# Patient Record
Sex: Female | Born: 1940 | Race: White | Hispanic: No | Smoking: Never smoker
Health system: Southern US, Community
[De-identification: ages and names within clinical notes are randomized; demographics above are authoritative.]

## PROBLEM LIST (undated history)

## (undated) DIAGNOSIS — Z8619 Personal history of other infectious and parasitic diseases: Secondary | ICD-10-CM

## (undated) DIAGNOSIS — E119 Type 2 diabetes mellitus without complications: Secondary | ICD-10-CM

## (undated) DIAGNOSIS — J45909 Unspecified asthma, uncomplicated: Secondary | ICD-10-CM

## (undated) DIAGNOSIS — I10 Essential (primary) hypertension: Secondary | ICD-10-CM

## (undated) DIAGNOSIS — F419 Anxiety disorder, unspecified: Secondary | ICD-10-CM

## (undated) HISTORY — PX: HERNIA REPAIR: SHX51

## (undated) HISTORY — PX: CHOLECYSTECTOMY: SHX55

---

## 2003-08-11 ENCOUNTER — Other Ambulatory Visit: Payer: Self-pay

## 2004-06-06 ENCOUNTER — Ambulatory Visit: Payer: Self-pay | Admitting: Internal Medicine

## 2004-06-19 ENCOUNTER — Emergency Department: Payer: Self-pay | Admitting: Emergency Medicine

## 2004-06-26 ENCOUNTER — Emergency Department: Payer: Self-pay | Admitting: Emergency Medicine

## 2004-10-22 ENCOUNTER — Emergency Department: Payer: Self-pay | Admitting: Emergency Medicine

## 2007-04-06 ENCOUNTER — Ambulatory Visit: Payer: Self-pay | Admitting: Gastroenterology

## 2007-09-09 ENCOUNTER — Ambulatory Visit: Payer: Self-pay | Admitting: Internal Medicine

## 2007-12-06 ENCOUNTER — Emergency Department: Payer: Self-pay | Admitting: Emergency Medicine

## 2007-12-06 ENCOUNTER — Other Ambulatory Visit: Payer: Self-pay

## 2007-12-14 ENCOUNTER — Other Ambulatory Visit: Payer: Self-pay

## 2007-12-16 ENCOUNTER — Inpatient Hospital Stay: Payer: Self-pay | Admitting: Internal Medicine

## 2008-02-17 ENCOUNTER — Inpatient Hospital Stay: Payer: Self-pay | Admitting: Internal Medicine

## 2008-04-05 ENCOUNTER — Ambulatory Visit: Payer: Self-pay | Admitting: Internal Medicine

## 2008-11-06 ENCOUNTER — Emergency Department: Payer: Self-pay | Admitting: Emergency Medicine

## 2010-10-15 ENCOUNTER — Emergency Department: Payer: Self-pay | Admitting: Emergency Medicine

## 2010-12-21 ENCOUNTER — Emergency Department: Payer: Self-pay | Admitting: Unknown Physician Specialty

## 2011-07-27 ENCOUNTER — Ambulatory Visit: Payer: Self-pay | Admitting: Internal Medicine

## 2011-12-02 DIAGNOSIS — E1142 Type 2 diabetes mellitus with diabetic polyneuropathy: Secondary | ICD-10-CM | POA: Diagnosis not present

## 2011-12-02 DIAGNOSIS — I10 Essential (primary) hypertension: Secondary | ICD-10-CM | POA: Diagnosis not present

## 2011-12-16 DIAGNOSIS — L270 Generalized skin eruption due to drugs and medicaments taken internally: Secondary | ICD-10-CM | POA: Diagnosis not present

## 2011-12-27 DIAGNOSIS — F411 Generalized anxiety disorder: Secondary | ICD-10-CM | POA: Diagnosis not present

## 2011-12-27 DIAGNOSIS — I10 Essential (primary) hypertension: Secondary | ICD-10-CM | POA: Diagnosis not present

## 2011-12-27 DIAGNOSIS — E1142 Type 2 diabetes mellitus with diabetic polyneuropathy: Secondary | ICD-10-CM | POA: Diagnosis not present

## 2012-01-15 DIAGNOSIS — I10 Essential (primary) hypertension: Secondary | ICD-10-CM | POA: Diagnosis not present

## 2012-01-15 DIAGNOSIS — N3000 Acute cystitis without hematuria: Secondary | ICD-10-CM | POA: Diagnosis not present

## 2012-01-15 DIAGNOSIS — F411 Generalized anxiety disorder: Secondary | ICD-10-CM | POA: Diagnosis not present

## 2012-01-31 DIAGNOSIS — I10 Essential (primary) hypertension: Secondary | ICD-10-CM | POA: Diagnosis not present

## 2012-01-31 DIAGNOSIS — F411 Generalized anxiety disorder: Secondary | ICD-10-CM | POA: Diagnosis not present

## 2012-01-31 DIAGNOSIS — E1142 Type 2 diabetes mellitus with diabetic polyneuropathy: Secondary | ICD-10-CM | POA: Diagnosis not present

## 2012-03-20 DIAGNOSIS — E669 Obesity, unspecified: Secondary | ICD-10-CM | POA: Diagnosis not present

## 2012-03-20 DIAGNOSIS — T428X5A Adverse effect of antiparkinsonism drugs and other central muscle-tone depressants, initial encounter: Secondary | ICD-10-CM | POA: Diagnosis not present

## 2012-03-20 DIAGNOSIS — I10 Essential (primary) hypertension: Secondary | ICD-10-CM | POA: Diagnosis not present

## 2012-05-29 DIAGNOSIS — E1149 Type 2 diabetes mellitus with other diabetic neurological complication: Secondary | ICD-10-CM | POA: Diagnosis not present

## 2012-05-29 DIAGNOSIS — E119 Type 2 diabetes mellitus without complications: Secondary | ICD-10-CM | POA: Diagnosis not present

## 2012-05-29 DIAGNOSIS — T428X5A Adverse effect of antiparkinsonism drugs and other central muscle-tone depressants, initial encounter: Secondary | ICD-10-CM | POA: Diagnosis not present

## 2012-06-26 DIAGNOSIS — E119 Type 2 diabetes mellitus without complications: Secondary | ICD-10-CM | POA: Diagnosis not present

## 2012-06-26 DIAGNOSIS — I10 Essential (primary) hypertension: Secondary | ICD-10-CM | POA: Diagnosis not present

## 2012-06-26 DIAGNOSIS — E1149 Type 2 diabetes mellitus with other diabetic neurological complication: Secondary | ICD-10-CM | POA: Diagnosis not present

## 2012-06-26 DIAGNOSIS — T783XXA Angioneurotic edema, initial encounter: Secondary | ICD-10-CM | POA: Diagnosis not present

## 2012-07-03 DIAGNOSIS — D649 Anemia, unspecified: Secondary | ICD-10-CM | POA: Diagnosis not present

## 2012-07-03 DIAGNOSIS — E7800 Pure hypercholesterolemia, unspecified: Secondary | ICD-10-CM | POA: Diagnosis not present

## 2012-07-24 DIAGNOSIS — I739 Peripheral vascular disease, unspecified: Secondary | ICD-10-CM | POA: Diagnosis not present

## 2012-07-24 DIAGNOSIS — F411 Generalized anxiety disorder: Secondary | ICD-10-CM | POA: Diagnosis not present

## 2012-07-24 DIAGNOSIS — G219 Secondary parkinsonism, unspecified: Secondary | ICD-10-CM | POA: Diagnosis not present

## 2012-09-28 DIAGNOSIS — M545 Low back pain: Secondary | ICD-10-CM | POA: Diagnosis not present

## 2012-09-28 DIAGNOSIS — N3000 Acute cystitis without hematuria: Secondary | ICD-10-CM | POA: Diagnosis not present

## 2012-10-12 DIAGNOSIS — J00 Acute nasopharyngitis [common cold]: Secondary | ICD-10-CM | POA: Diagnosis not present

## 2012-12-03 DIAGNOSIS — K140 Glossitis: Secondary | ICD-10-CM | POA: Diagnosis not present

## 2012-12-03 DIAGNOSIS — E785 Hyperlipidemia, unspecified: Secondary | ICD-10-CM | POA: Diagnosis not present

## 2012-12-03 DIAGNOSIS — I10 Essential (primary) hypertension: Secondary | ICD-10-CM | POA: Diagnosis not present

## 2012-12-03 DIAGNOSIS — G219 Secondary parkinsonism, unspecified: Secondary | ICD-10-CM | POA: Diagnosis not present

## 2012-12-11 DIAGNOSIS — K1370 Unspecified lesions of oral mucosa: Secondary | ICD-10-CM | POA: Diagnosis not present

## 2012-12-11 DIAGNOSIS — D1039 Benign neoplasm of other parts of mouth: Secondary | ICD-10-CM | POA: Diagnosis not present

## 2013-01-20 DIAGNOSIS — E11329 Type 2 diabetes mellitus with mild nonproliferative diabetic retinopathy without macular edema: Secondary | ICD-10-CM | POA: Diagnosis not present

## 2013-02-05 DIAGNOSIS — E669 Obesity, unspecified: Secondary | ICD-10-CM | POA: Diagnosis not present

## 2013-02-05 DIAGNOSIS — G219 Secondary parkinsonism, unspecified: Secondary | ICD-10-CM | POA: Diagnosis not present

## 2013-02-05 DIAGNOSIS — T783XXA Angioneurotic edema, initial encounter: Secondary | ICD-10-CM | POA: Diagnosis not present

## 2013-02-05 DIAGNOSIS — E785 Hyperlipidemia, unspecified: Secondary | ICD-10-CM | POA: Diagnosis not present

## 2013-02-12 DIAGNOSIS — H18419 Arcus senilis, unspecified eye: Secondary | ICD-10-CM | POA: Diagnosis not present

## 2013-02-12 DIAGNOSIS — H25019 Cortical age-related cataract, unspecified eye: Secondary | ICD-10-CM | POA: Diagnosis not present

## 2013-02-12 DIAGNOSIS — E119 Type 2 diabetes mellitus without complications: Secondary | ICD-10-CM | POA: Diagnosis not present

## 2013-02-12 DIAGNOSIS — H2510 Age-related nuclear cataract, unspecified eye: Secondary | ICD-10-CM | POA: Diagnosis not present

## 2013-03-11 DIAGNOSIS — B078 Other viral warts: Secondary | ICD-10-CM | POA: Diagnosis not present

## 2013-03-11 DIAGNOSIS — E119 Type 2 diabetes mellitus without complications: Secondary | ICD-10-CM | POA: Diagnosis not present

## 2013-03-11 DIAGNOSIS — L570 Actinic keratosis: Secondary | ICD-10-CM | POA: Diagnosis not present

## 2013-04-12 DIAGNOSIS — H2510 Age-related nuclear cataract, unspecified eye: Secondary | ICD-10-CM | POA: Diagnosis not present

## 2013-04-12 DIAGNOSIS — H269 Unspecified cataract: Secondary | ICD-10-CM | POA: Diagnosis not present

## 2013-04-13 DIAGNOSIS — H2510 Age-related nuclear cataract, unspecified eye: Secondary | ICD-10-CM | POA: Diagnosis not present

## 2013-05-03 DIAGNOSIS — H269 Unspecified cataract: Secondary | ICD-10-CM | POA: Diagnosis not present

## 2013-05-03 DIAGNOSIS — H2510 Age-related nuclear cataract, unspecified eye: Secondary | ICD-10-CM | POA: Diagnosis not present

## 2013-05-11 DIAGNOSIS — E1349 Other specified diabetes mellitus with other diabetic neurological complication: Secondary | ICD-10-CM | POA: Diagnosis not present

## 2013-05-11 DIAGNOSIS — E1142 Type 2 diabetes mellitus with diabetic polyneuropathy: Secondary | ICD-10-CM | POA: Diagnosis not present

## 2013-05-11 DIAGNOSIS — E1149 Type 2 diabetes mellitus with other diabetic neurological complication: Secondary | ICD-10-CM | POA: Diagnosis not present

## 2013-07-13 DIAGNOSIS — N390 Urinary tract infection, site not specified: Secondary | ICD-10-CM | POA: Diagnosis not present

## 2013-07-13 DIAGNOSIS — E785 Hyperlipidemia, unspecified: Secondary | ICD-10-CM | POA: Diagnosis not present

## 2013-07-13 DIAGNOSIS — G219 Secondary parkinsonism, unspecified: Secondary | ICD-10-CM | POA: Diagnosis not present

## 2013-07-13 DIAGNOSIS — I10 Essential (primary) hypertension: Secondary | ICD-10-CM | POA: Diagnosis not present

## 2013-07-19 DIAGNOSIS — K055 Other periodontal diseases: Secondary | ICD-10-CM | POA: Diagnosis not present

## 2013-08-05 DIAGNOSIS — E785 Hyperlipidemia, unspecified: Secondary | ICD-10-CM | POA: Diagnosis not present

## 2013-08-05 DIAGNOSIS — T428X5A Adverse effect of antiparkinsonism drugs and other central muscle-tone depressants, initial encounter: Secondary | ICD-10-CM | POA: Diagnosis not present

## 2013-08-05 DIAGNOSIS — T783XXA Angioneurotic edema, initial encounter: Secondary | ICD-10-CM | POA: Diagnosis not present

## 2013-08-05 DIAGNOSIS — I10 Essential (primary) hypertension: Secondary | ICD-10-CM | POA: Diagnosis not present

## 2013-10-19 DIAGNOSIS — T783XXA Angioneurotic edema, initial encounter: Secondary | ICD-10-CM | POA: Diagnosis not present

## 2013-10-19 DIAGNOSIS — J069 Acute upper respiratory infection, unspecified: Secondary | ICD-10-CM | POA: Diagnosis not present

## 2013-10-19 DIAGNOSIS — J42 Unspecified chronic bronchitis: Secondary | ICD-10-CM | POA: Diagnosis not present

## 2013-10-19 DIAGNOSIS — I10 Essential (primary) hypertension: Secondary | ICD-10-CM | POA: Diagnosis not present

## 2013-11-25 DIAGNOSIS — E119 Type 2 diabetes mellitus without complications: Secondary | ICD-10-CM | POA: Diagnosis not present

## 2013-11-25 DIAGNOSIS — I10 Essential (primary) hypertension: Secondary | ICD-10-CM | POA: Diagnosis not present

## 2013-11-25 DIAGNOSIS — J309 Allergic rhinitis, unspecified: Secondary | ICD-10-CM | POA: Diagnosis not present

## 2013-11-25 DIAGNOSIS — J209 Acute bronchitis, unspecified: Secondary | ICD-10-CM | POA: Diagnosis not present

## 2013-12-24 DIAGNOSIS — I10 Essential (primary) hypertension: Secondary | ICD-10-CM | POA: Diagnosis not present

## 2013-12-24 DIAGNOSIS — E669 Obesity, unspecified: Secondary | ICD-10-CM | POA: Diagnosis not present

## 2013-12-24 DIAGNOSIS — T783XXA Angioneurotic edema, initial encounter: Secondary | ICD-10-CM | POA: Diagnosis not present

## 2013-12-24 DIAGNOSIS — T428X5A Adverse effect of antiparkinsonism drugs and other central muscle-tone depressants, initial encounter: Secondary | ICD-10-CM | POA: Diagnosis not present

## 2014-02-25 DIAGNOSIS — IMO0001 Reserved for inherently not codable concepts without codable children: Secondary | ICD-10-CM | POA: Diagnosis not present

## 2014-02-25 DIAGNOSIS — E119 Type 2 diabetes mellitus without complications: Secondary | ICD-10-CM | POA: Diagnosis not present

## 2014-05-05 DIAGNOSIS — G219 Secondary parkinsonism, unspecified: Secondary | ICD-10-CM | POA: Diagnosis not present

## 2014-05-05 DIAGNOSIS — E669 Obesity, unspecified: Secondary | ICD-10-CM | POA: Diagnosis not present

## 2014-05-05 DIAGNOSIS — T783XXA Angioneurotic edema, initial encounter: Secondary | ICD-10-CM | POA: Diagnosis not present

## 2014-06-22 DIAGNOSIS — Z23 Encounter for immunization: Secondary | ICD-10-CM | POA: Diagnosis not present

## 2014-08-09 DIAGNOSIS — E1140 Type 2 diabetes mellitus with diabetic neuropathy, unspecified: Secondary | ICD-10-CM | POA: Diagnosis not present

## 2014-08-09 DIAGNOSIS — E11319 Type 2 diabetes mellitus with unspecified diabetic retinopathy without macular edema: Secondary | ICD-10-CM | POA: Diagnosis not present

## 2014-08-09 DIAGNOSIS — E119 Type 2 diabetes mellitus without complications: Secondary | ICD-10-CM | POA: Diagnosis not present

## 2014-08-09 DIAGNOSIS — E784 Other hyperlipidemia: Secondary | ICD-10-CM | POA: Diagnosis not present

## 2014-08-22 ENCOUNTER — Ambulatory Visit: Payer: Self-pay | Admitting: Internal Medicine

## 2014-08-22 DIAGNOSIS — J219 Acute bronchiolitis, unspecified: Secondary | ICD-10-CM | POA: Diagnosis not present

## 2014-08-22 DIAGNOSIS — M4696 Unspecified inflammatory spondylopathy, lumbar region: Secondary | ICD-10-CM | POA: Diagnosis not present

## 2014-08-22 DIAGNOSIS — E119 Type 2 diabetes mellitus without complications: Secondary | ICD-10-CM | POA: Diagnosis not present

## 2014-08-22 DIAGNOSIS — J441 Chronic obstructive pulmonary disease with (acute) exacerbation: Secondary | ICD-10-CM | POA: Diagnosis not present

## 2014-08-22 DIAGNOSIS — M438X6 Other specified deforming dorsopathies, lumbar region: Secondary | ICD-10-CM | POA: Diagnosis not present

## 2014-08-22 DIAGNOSIS — M5417 Radiculopathy, lumbosacral region: Secondary | ICD-10-CM | POA: Diagnosis not present

## 2014-08-29 DIAGNOSIS — T783XXA Angioneurotic edema, initial encounter: Secondary | ICD-10-CM | POA: Diagnosis not present

## 2014-08-29 DIAGNOSIS — G219 Secondary parkinsonism, unspecified: Secondary | ICD-10-CM | POA: Diagnosis not present

## 2014-08-29 DIAGNOSIS — J399 Disease of upper respiratory tract, unspecified: Secondary | ICD-10-CM | POA: Diagnosis not present

## 2014-08-29 DIAGNOSIS — M5440 Lumbago with sciatica, unspecified side: Secondary | ICD-10-CM | POA: Diagnosis not present

## 2014-10-04 DIAGNOSIS — Z888 Allergy status to other drugs, medicaments and biological substances status: Secondary | ICD-10-CM | POA: Diagnosis not present

## 2014-10-04 DIAGNOSIS — E1140 Type 2 diabetes mellitus with diabetic neuropathy, unspecified: Secondary | ICD-10-CM | POA: Diagnosis not present

## 2014-10-04 DIAGNOSIS — G20 Parkinson's disease: Secondary | ICD-10-CM | POA: Diagnosis not present

## 2014-10-04 DIAGNOSIS — I10 Essential (primary) hypertension: Secondary | ICD-10-CM | POA: Diagnosis not present

## 2014-11-03 DIAGNOSIS — E119 Type 2 diabetes mellitus without complications: Secondary | ICD-10-CM | POA: Diagnosis not present

## 2014-11-03 DIAGNOSIS — I10 Essential (primary) hypertension: Secondary | ICD-10-CM | POA: Diagnosis not present

## 2014-11-03 DIAGNOSIS — G219 Secondary parkinsonism, unspecified: Secondary | ICD-10-CM | POA: Diagnosis not present

## 2014-11-03 DIAGNOSIS — Z888 Allergy status to other drugs, medicaments and biological substances status: Secondary | ICD-10-CM | POA: Diagnosis not present

## 2014-12-13 DIAGNOSIS — G219 Secondary parkinsonism, unspecified: Secondary | ICD-10-CM | POA: Diagnosis not present

## 2014-12-13 DIAGNOSIS — R0602 Shortness of breath: Secondary | ICD-10-CM | POA: Diagnosis not present

## 2014-12-13 DIAGNOSIS — J219 Acute bronchiolitis, unspecified: Secondary | ICD-10-CM | POA: Diagnosis not present

## 2014-12-13 DIAGNOSIS — E784 Other hyperlipidemia: Secondary | ICD-10-CM | POA: Diagnosis not present

## 2014-12-20 DIAGNOSIS — E784 Other hyperlipidemia: Secondary | ICD-10-CM | POA: Diagnosis not present

## 2014-12-20 DIAGNOSIS — R0602 Shortness of breath: Secondary | ICD-10-CM | POA: Diagnosis not present

## 2014-12-20 DIAGNOSIS — T783XXA Angioneurotic edema, initial encounter: Secondary | ICD-10-CM | POA: Diagnosis not present

## 2014-12-20 DIAGNOSIS — G219 Secondary parkinsonism, unspecified: Secondary | ICD-10-CM | POA: Diagnosis not present

## 2015-01-31 DIAGNOSIS — E669 Obesity, unspecified: Secondary | ICD-10-CM | POA: Diagnosis not present

## 2015-01-31 DIAGNOSIS — Z888 Allergy status to other drugs, medicaments and biological substances status: Secondary | ICD-10-CM | POA: Diagnosis not present

## 2015-01-31 DIAGNOSIS — E119 Type 2 diabetes mellitus without complications: Secondary | ICD-10-CM | POA: Diagnosis not present

## 2015-01-31 DIAGNOSIS — G219 Secondary parkinsonism, unspecified: Secondary | ICD-10-CM | POA: Diagnosis not present

## 2015-03-10 DIAGNOSIS — E119 Type 2 diabetes mellitus without complications: Secondary | ICD-10-CM | POA: Diagnosis not present

## 2015-03-13 DIAGNOSIS — R251 Tremor, unspecified: Secondary | ICD-10-CM | POA: Diagnosis not present

## 2015-03-13 DIAGNOSIS — L0390 Cellulitis, unspecified: Secondary | ICD-10-CM | POA: Diagnosis not present

## 2015-03-13 DIAGNOSIS — E1140 Type 2 diabetes mellitus with diabetic neuropathy, unspecified: Secondary | ICD-10-CM | POA: Diagnosis not present

## 2015-03-13 DIAGNOSIS — E119 Type 2 diabetes mellitus without complications: Secondary | ICD-10-CM | POA: Diagnosis not present

## 2015-03-20 DIAGNOSIS — R143 Flatulence: Secondary | ICD-10-CM | POA: Diagnosis not present

## 2015-03-20 DIAGNOSIS — M1A0711 Idiopathic chronic gout, right ankle and foot, with tophus (tophi): Secondary | ICD-10-CM | POA: Diagnosis not present

## 2015-03-20 DIAGNOSIS — R141 Gas pain: Secondary | ICD-10-CM | POA: Diagnosis not present

## 2015-03-20 DIAGNOSIS — L0390 Cellulitis, unspecified: Secondary | ICD-10-CM | POA: Diagnosis not present

## 2015-03-27 DIAGNOSIS — R143 Flatulence: Secondary | ICD-10-CM | POA: Diagnosis not present

## 2015-03-27 DIAGNOSIS — R141 Gas pain: Secondary | ICD-10-CM | POA: Diagnosis not present

## 2015-03-27 DIAGNOSIS — M109 Gout, unspecified: Secondary | ICD-10-CM | POA: Diagnosis not present

## 2015-03-27 DIAGNOSIS — M1A0711 Idiopathic chronic gout, right ankle and foot, with tophus (tophi): Secondary | ICD-10-CM | POA: Diagnosis not present

## 2015-04-14 DIAGNOSIS — I10 Essential (primary) hypertension: Secondary | ICD-10-CM | POA: Diagnosis not present

## 2015-04-14 DIAGNOSIS — M109 Gout, unspecified: Secondary | ICD-10-CM | POA: Diagnosis not present

## 2015-04-14 DIAGNOSIS — G219 Secondary parkinsonism, unspecified: Secondary | ICD-10-CM | POA: Diagnosis not present

## 2015-04-14 DIAGNOSIS — G20 Parkinson's disease: Secondary | ICD-10-CM | POA: Diagnosis not present

## 2015-05-18 DIAGNOSIS — G219 Secondary parkinsonism, unspecified: Secondary | ICD-10-CM | POA: Diagnosis not present

## 2015-05-18 DIAGNOSIS — R05 Cough: Secondary | ICD-10-CM | POA: Diagnosis not present

## 2015-05-18 DIAGNOSIS — M1A0711 Idiopathic chronic gout, right ankle and foot, with tophus (tophi): Secondary | ICD-10-CM | POA: Diagnosis not present

## 2015-05-18 DIAGNOSIS — I10 Essential (primary) hypertension: Secondary | ICD-10-CM | POA: Diagnosis not present

## 2015-05-30 DIAGNOSIS — Z23 Encounter for immunization: Secondary | ICD-10-CM | POA: Diagnosis not present

## 2015-07-11 DIAGNOSIS — E1140 Type 2 diabetes mellitus with diabetic neuropathy, unspecified: Secondary | ICD-10-CM | POA: Diagnosis not present

## 2015-07-11 DIAGNOSIS — R141 Gas pain: Secondary | ICD-10-CM | POA: Diagnosis not present

## 2015-07-11 DIAGNOSIS — T783XXA Angioneurotic edema, initial encounter: Secondary | ICD-10-CM | POA: Diagnosis not present

## 2015-07-11 DIAGNOSIS — R143 Flatulence: Secondary | ICD-10-CM | POA: Diagnosis not present

## 2015-08-16 DIAGNOSIS — J219 Acute bronchiolitis, unspecified: Secondary | ICD-10-CM | POA: Diagnosis not present

## 2015-08-16 DIAGNOSIS — J441 Chronic obstructive pulmonary disease with (acute) exacerbation: Secondary | ICD-10-CM | POA: Diagnosis not present

## 2015-08-16 DIAGNOSIS — G20 Parkinson's disease: Secondary | ICD-10-CM | POA: Diagnosis not present

## 2015-08-16 DIAGNOSIS — M1A0711 Idiopathic chronic gout, right ankle and foot, with tophus (tophi): Secondary | ICD-10-CM | POA: Diagnosis not present

## 2015-08-22 DIAGNOSIS — G20 Parkinson's disease: Secondary | ICD-10-CM | POA: Diagnosis not present

## 2015-08-22 DIAGNOSIS — I509 Heart failure, unspecified: Secondary | ICD-10-CM | POA: Diagnosis not present

## 2015-08-22 DIAGNOSIS — Z888 Allergy status to other drugs, medicaments and biological substances status: Secondary | ICD-10-CM | POA: Diagnosis not present

## 2015-08-22 DIAGNOSIS — I5033 Acute on chronic diastolic (congestive) heart failure: Secondary | ICD-10-CM | POA: Diagnosis not present

## 2015-09-03 ENCOUNTER — Emergency Department
Admission: EM | Admit: 2015-09-03 | Discharge: 2015-09-03 | Disposition: A | Discharge status: Home / Self Care | Payer: Medicare Other | Attending: Emergency Medicine | Admitting: Emergency Medicine

## 2015-09-03 ENCOUNTER — Emergency Department: Payer: Medicare Other

## 2015-09-03 DIAGNOSIS — R05 Cough: Secondary | ICD-10-CM | POA: Diagnosis present

## 2015-09-03 DIAGNOSIS — Z880 Allergy status to penicillin: Secondary | ICD-10-CM | POA: Diagnosis not present

## 2015-09-03 DIAGNOSIS — R0602 Shortness of breath: Secondary | ICD-10-CM | POA: Diagnosis not present

## 2015-09-03 DIAGNOSIS — J069 Acute upper respiratory infection, unspecified: Secondary | ICD-10-CM | POA: Diagnosis not present

## 2015-09-03 DIAGNOSIS — R509 Fever, unspecified: Secondary | ICD-10-CM | POA: Diagnosis not present

## 2015-09-03 MED ORDER — GUAIFENESIN-CODEINE 100-10 MG/5ML PO SOLN: 5.0000 mL | Freq: Three times a day (TID) | ORAL | Status: DC | PRN

## 2015-09-03 NOTE — ED Provider Notes (Signed)
[facility] Emergency Department Provider Note  ____________________________________________  Time seen: Approximately 11:56 AM  I have reviewed the triage vital signs and the nursing notes.   HISTORY  Chief Complaint URI   HPI Sabrina Quinn is a 75 y.o. female is here with complaint of chills, body aches, productive cough that started yesterday. Patient states she tried Robitussin and Aleve for her symptoms without any relief. She states her temperature was 99 at home. She reports she has had some shortness of breath with her productive cough. She denies any chest pain, diaphoresis or indigestion. She states 2 weeks ago she was seen by her PCP and diagnosed with pneumonia. She states that she took all the antibiotic of which she cannot remember the name and was feeling better. She states she has never been a smoker and in the past. Currently she denies any sore throat, earache, runny nose, nausea, vomiting or diarrhea.   No past medical history on file.  There are no active problems to display for this patient.   No past surgical history on file.  Current Outpatient Rx  Name  Route  Sig  Dispense  Refill  . guaiFENesin-codeine 100-10 MG/5ML syrup   Oral   Take 5 mLs by mouth 3 (three) times daily as needed for cough.   100 mL   0     Allergies Penicillins  No family history on file.  Social History Social History  Substance Use Topics  . Smoking status: Not on file  . Smokeless tobacco: Not on file  . Alcohol Use: Not on file    Review of Systems Constitutional: Questionable fever/positive chills ENT: No sore throat. Cardiovascular: Denies chest pain. Respiratory: Denies shortness of breath. Positive cough Gastrointestinal: No abdominal pain.  No nausea, no vomiting.  No diarrhea.   Genitourinary: Negative for dysuria. Musculoskeletal: Negative for back pain. Skin: Negative for rash. Neurological: Negative for headaches, focal weakness  or numbness.  10-point ROS otherwise negative.  ____________________________________________   PHYSICAL EXAM:  VITAL SIGNS: ED Triage Vitals  Enc Vitals Group     BP 09/03/15 1129 160/64 mmHg     Pulse Rate 09/03/15 1129 82     Resp 09/03/15 1129 20     Temp 09/03/15 1129 98.7 F (37.1 C)     Temp Source 09/03/15 1129 Oral     SpO2 09/03/15 1129 98 %     Weight 09/03/15 1129 220 lb (99.791 kg)     Height 09/03/15 1129 5\' 4"  (1.626 m)     Head Cir --      Peak Flow --      Pain Score --      Pain Loc --      Pain Edu? --      Excl. in [place]? --     Constitutional: Alert and oriented. Well appearing and in no acute distress. Eyes: Conjunctivae are normal. PERRL. EOMI. Head: Atraumatic. Nose: No congestion/rhinnorhea.   EACs and TMs are clear bilaterally. Mouth/Throat: Mucous membranes are moist.  Oropharynx non-erythematous. Neck: No stridor.  Supple Hematological/Lymphatic/Immunilogical: No cervical lymphadenopathy. Cardiovascular: Normal rate, regular rhythm. Grossly normal heart sounds.  Good peripheral circulation. Respiratory: Normal respiratory effort.  No retractions. Lungs CTAB. Gastrointestinal: Soft and nontender. No distention. Bowel sounds normoactive 4 quadrants. Musculoskeletal: Moves upper and lower extremities without any difficulty. Normal gait was noted. Neurologic:  Normal speech and language. No gross focal neurologic deficits are appreciated. No gait instability. Skin:  Skin is warm, dry  and intact. No rash noted. Psychiatric: Mood and affect are normal. Speech and behavior are normal.  ____________________________________________   LABS (all labs ordered are listed, but only abnormal results are displayed)  Labs Reviewed - No data to display   RADIOLOGY  Chest x-ray per radiologist shows no active cardiopulmonary disease. ____________________________________________   PROCEDURES  Procedure(s) performed: None  Critical Care performed:  No  ____________________________________________   INITIAL IMPRESSION / ASSESSMENT AND PLAN / ED COURSE  Pertinent labs & imaging results that were available during my care of the patient were reviewed by me and considered in my medical decision making (see chart for details).  Patient was started on Robitussin-AC as needed for cough. She will follow-up with her primary care doctor if any continued problems. She is told day that there is no evidence of pneumonia on her chest x-ray. She make tea taking Tylenol if needed for body aches or fever. ____________________________________________   FINAL CLINICAL IMPRESSION(S) / ED DIAGNOSES  Final diagnoses:  Acute upper respiratory infection      Johnn Hai, PA-C 09/03/15 1349  Daymon Larsen, MD 09/03/15 1447

## 2015-09-03 NOTE — ED Notes (Addendum)
Pt reports chills, body aches  and cough productive with yellow sputum that started yesterday. Pt also report SOB. Denies chest pain. States her brother had a URI. Tried Robutussin and Aleve for symptoms.

## 2015-09-03 NOTE — Discharge Instructions (Signed)
Upper Respiratory Infection, Adult Most upper respiratory infections (URIs) are caused by a virus. A URI affects the nose, throat, and upper air passages. The most common type of URI is often called "the common cold." HOME CARE   Take medicines only as told by your doctor.  Gargle warm saltwater or take cough drops to comfort your throat as told by your doctor.  Use a warm mist humidifier or inhale steam from a shower to increase air moisture. This may make it easier to breathe.  Drink enough fluid to keep your pee (urine) clear or pale yellow.  Eat soups and other clear broths.  Have a healthy diet.  Rest as needed.  Go back to work when your fever is gone or your doctor says it is okay.  You may need to stay home longer to avoid giving your URI to others.  You can also wear a face mask and wash your hands often to prevent spread of the virus.  Use your inhaler more if you have asthma.  Do not use any tobacco products, including cigarettes, chewing tobacco, or electronic cigarettes. If you need help quitting, ask your doctor. GET HELP IF:  You are getting worse, not better.  Your symptoms are not helped by medicine.  You have chills.  You are getting more short of breath.  You have brown or red mucus.  You have yellow or brown discharge from your nose.  You have pain in your face, especially when you bend forward.  You have a fever.  You have puffy (swollen) neck glands.  You have pain while swallowing.  You have white areas in the back of your throat. GET HELP RIGHT AWAY IF:   You have very bad or constant:  Headache.  Ear pain.  Pain in your forehead, behind your eyes, and over your cheekbones (sinus pain).  Chest pain.  You have long-lasting (chronic) lung disease and any of the following:  Wheezing.  Long-lasting cough.  Coughing up blood.  A change in your usual mucus.  You have a stiff neck.  You have changes in  your:  Vision.  Hearing.  Thinking.  Mood. MAKE SURE YOU:   Understand these instructions.  Will watch your condition.  Will get help right away if you are not doing well or get worse.   This information is not intended to replace advice given to you by your health care provider. Make sure you discuss any questions you have with your health care provider.   Document Released: 01/22/2008 Document Revised: 12/20/2014 Document Reviewed: 11/10/2013 Elsevier Interactive Patient Education 2016 Elsevier Inc[employer]  Cough, Adult A cough helps to clear your throat and lungs. A cough may last only 2-3 weeks (acute), or it may last longer than 8 weeks (chronic). Many different things can cause a cough. A cough may be a sign of an illness or another medical condition. HOME CARE  Pay attention to any changes in your cough.  Take medicines only as told by your doctor.  If you were prescribed an antibiotic medicine, take it as told by your doctor. Do not stop taking it even if you start to feel better.  Talk with your doctor before you try using a cough medicine.  Drink enough fluid to keep your pee (urine) clear or pale yellow.  If the air is dry, use a cold steam vaporizer or humidifier in your home.  Stay away from things that make you cough at work or at home.  If your cough is worse at night, try using extra pillows to raise your head up higher while you sleep.  Do not smoke, and try not to be around smoke. If you need help quitting, ask your doctor.  Do not have caffeine.  Do not drink alcohol.  Rest as needed. GET HELP IF:  You have new problems (symptoms).  You cough up yellow fluid (pus).  Your cough does not get better after 2-3 weeks, or your cough gets worse.  Medicine does not help your cough and you are not sleeping well.  You have pain that gets worse or pain that is not helped with medicine.  You have a fever.  You are losing weight and you do not know  why.  You have night sweats. GET HELP RIGHT AWAY IF:  You cough up blood.  You have trouble breathing.  Your heartbeat is very fast.   This information is not intended to replace advice given to you by your health care provider. Make sure you discuss any questions you have with your health care provider.   Document Released: 04/18/2011 Document Revised: 04/26/2015 Document Reviewed: 10/12/2014 Elsevier Interactive Patient Education 2016 [place] with your primary care doctor if any continued problems. Take cough medication only as directed. He may continue taking Tylenol if needed for fever, chills, or body aches.

## 2015-09-05 DIAGNOSIS — G20 Parkinson's disease: Secondary | ICD-10-CM | POA: Diagnosis not present

## 2015-09-05 DIAGNOSIS — J069 Acute upper respiratory infection, unspecified: Secondary | ICD-10-CM | POA: Diagnosis not present

## 2015-09-05 DIAGNOSIS — E1140 Type 2 diabetes mellitus with diabetic neuropathy, unspecified: Secondary | ICD-10-CM | POA: Diagnosis not present

## 2015-10-09 DIAGNOSIS — E1140 Type 2 diabetes mellitus with diabetic neuropathy, unspecified: Secondary | ICD-10-CM | POA: Diagnosis not present

## 2015-10-09 DIAGNOSIS — Z888 Allergy status to other drugs, medicaments and biological substances status: Secondary | ICD-10-CM | POA: Diagnosis not present

## 2015-10-09 DIAGNOSIS — G20 Parkinson's disease: Secondary | ICD-10-CM | POA: Diagnosis not present

## 2015-10-09 DIAGNOSIS — I10 Essential (primary) hypertension: Secondary | ICD-10-CM | POA: Diagnosis not present

## 2015-11-10 DIAGNOSIS — R5381 Other malaise: Secondary | ICD-10-CM | POA: Diagnosis not present

## 2015-11-10 DIAGNOSIS — R141 Gas pain: Secondary | ICD-10-CM | POA: Diagnosis not present

## 2015-11-10 DIAGNOSIS — R142 Eructation: Secondary | ICD-10-CM | POA: Diagnosis not present

## 2015-11-10 DIAGNOSIS — E784 Other hyperlipidemia: Secondary | ICD-10-CM | POA: Diagnosis not present

## 2015-11-10 DIAGNOSIS — R143 Flatulence: Secondary | ICD-10-CM | POA: Diagnosis not present

## 2015-11-10 DIAGNOSIS — I10 Essential (primary) hypertension: Secondary | ICD-10-CM | POA: Diagnosis not present

## 2015-11-17 DIAGNOSIS — R197 Diarrhea, unspecified: Secondary | ICD-10-CM | POA: Diagnosis not present

## 2015-11-17 DIAGNOSIS — A047 Enterocolitis due to Clostridium difficile: Secondary | ICD-10-CM | POA: Diagnosis not present

## 2015-11-19 ENCOUNTER — Encounter: Payer: Self-pay | Admitting: Emergency Medicine

## 2015-11-19 ENCOUNTER — Emergency Department
Admission: EM | Admit: 2015-11-19 | Discharge: 2015-11-19 | Disposition: A | Discharge status: Home / Self Care | Payer: Medicare Other | Attending: Emergency Medicine | Admitting: Emergency Medicine

## 2015-11-19 DIAGNOSIS — J45909 Unspecified asthma, uncomplicated: Secondary | ICD-10-CM | POA: Diagnosis not present

## 2015-11-19 DIAGNOSIS — E119 Type 2 diabetes mellitus without complications: Secondary | ICD-10-CM | POA: Diagnosis not present

## 2015-11-19 DIAGNOSIS — I10 Essential (primary) hypertension: Secondary | ICD-10-CM | POA: Insufficient documentation

## 2015-11-19 DIAGNOSIS — F419 Anxiety disorder, unspecified: Secondary | ICD-10-CM | POA: Diagnosis not present

## 2015-11-19 HISTORY — DX: Unspecified asthma, uncomplicated: J45.909

## 2015-11-19 HISTORY — DX: Essential (primary) hypertension: I10

## 2015-11-19 HISTORY — DX: Anxiety disorder, unspecified: F41.9

## 2015-11-19 HISTORY — DX: Type 2 diabetes mellitus without complications: E11.9

## 2015-11-19 LAB — URINALYSIS COMPLETE WITH MICROSCOPIC (ARMC ONLY)
Bacteria, UA: NONE SEEN
Bilirubin Urine: NEGATIVE
Glucose, UA: 150 mg/dL — AB
HGB URINE DIPSTICK: NEGATIVE
Ketones, ur: NEGATIVE mg/dL
Nitrite: NEGATIVE
PH: 7 (ref 5.0–8.0)
Protein, ur: NEGATIVE mg/dL
Specific Gravity, Urine: 1.013 (ref 1.005–1.030)

## 2015-11-19 LAB — GLUCOSE, CAPILLARY: Glucose-Capillary: 149 mg/dL — ABNORMAL HIGH (ref 65–99)

## 2015-11-19 MED ORDER — ONDANSETRON 4 MG PO TBDP
ORAL_TABLET | ORAL | Status: AC
  Administered 2015-11-19: 4 mg via ORAL
  Filled 2015-11-19: qty 1

## 2015-11-19 MED ORDER — ALPRAZOLAM 0.25 MG PO TABS: 0.2500 mg | ORAL_TABLET | Freq: Two times a day (BID) | ORAL | Status: AC | PRN

## 2015-11-19 MED ORDER — ALPRAZOLAM 0.5 MG PO TABS
ORAL_TABLET | ORAL | Status: AC
  Administered 2015-11-19: 0.25 mg via ORAL
  Filled 2015-11-19: qty 1

## 2015-11-19 MED ORDER — ONDANSETRON 4 MG PO TBDP
4.0000 mg | ORAL_TABLET | Freq: Once | ORAL | Status: AC
  Administered 2015-11-19: 4 mg via ORAL

## 2015-11-19 MED ORDER — ONDANSETRON 4 MG PO TBDP: 4.0000 mg | ORAL_TABLET | Freq: Four times a day (QID) | ORAL | Status: DC | PRN

## 2015-11-19 MED ORDER — ALPRAZOLAM 0.25 MG PO TABS
0.2500 mg | ORAL_TABLET | ORAL | Status: AC
  Administered 2015-11-19: 0.25 mg via ORAL

## 2015-11-19 NOTE — ED Notes (Signed)
Pt has been anxious and had trouble sleeping mostly last 2 nights.  Reports has been out of xanax for 2 weeks.  Reports her doctor nurse looked at bottle wrong and said wasn't time for refill but pt reports it is time.  Has had some nausea.  Denies SI/HI.  Denies depression, only reports anxiety.

## 2015-11-19 NOTE — Discharge Instructions (Signed)
It is important that you follow up closely with your primary care doctor in the next couple of days.  Please return to the emergency room right away if you are to develop a fever, confusion, abdominal pain, severe nausea, pain, you are unable to keep food down, begin vomiting any dark or bloody fluid, you develop any dark or bloody stools, feel dehydrated, or other new concerns or symptoms arise.   Generalized Anxiety Disorder Generalized anxiety disorder (GAD) is a mental disorder. It interferes with life functions, including relationships, work, and school. GAD is different from normal anxiety, which everyone experiences at some point in their lives in response to specific life events and activities. Normal anxiety actually helps [place] prepare for and get through these life events and activities. Normal anxiety goes away after the event or activity is over.  GAD causes anxiety that is not necessarily related to specific events or activities. It also causes excess anxiety in proportion to specific events or activities. The anxiety associated with GAD is also difficult to control. GAD can vary from mild to severe. People with severe GAD can have intense waves of anxiety with physical symptoms (panic attacks).  SYMPTOMS The anxiety and worry associated with GAD are difficult to control. This anxiety and worry are related to many life events and activities and also occur more days than not for 6 months or longer. People with GAD also have three or more of the following symptoms (one or more in children):  Restlessness.   Fatigue.  Difficulty concentrating.   Irritability.  Muscle tension.  Difficulty sleeping or unsatisfying sleep. DIAGNOSIS GAD is diagnosed through an assessment by your health care provider. Your health care provider will ask you questions aboutyour mood,physical symptoms, and events in your life. Your health care provider may ask you about your medical history and use of  alcohol or drugs, including prescription medicines. Your health care provider may also do a physical exam and blood tests. Certain medical conditions and the use of certain substances can cause symptoms similar to those associated with GAD. Your health care provider may refer you to a mental health specialist for further evaluation. TREATMENT The following therapies are usually used to treat GAD:   Medication. Antidepressant medication usually is prescribed for long-term daily control. Antianxiety medicines may be added in severe cases, especially when panic attacks occur.   Talk therapy (psychotherapy). Certain types of talk therapy can be helpful in treating GAD by providing support, education, and guidance. A form of talk therapy called cognitive behavioral therapy can teach you healthy ways to think about and react to daily life events and activities.  Stress managementtechniques. These include yoga, meditation, and exercise and can be very helpful when they are practiced regularly. A mental health specialist can help determine which treatment is best for you. Some people see improvement with one therapy. However, other people require a combination of therapies.   This information is not intended to replace advice given to you by your health care provider. Make sure you discuss any questions you have with your health care provider.   Document Released: 11/30/2012 Document Revised: 08/26/2014 Document Reviewed: 11/30/2012 Elsevier Interactive Patient Education [employer].

## 2015-11-19 NOTE — ED Provider Notes (Signed)
[facility] Emergency Department Provider Note  ____________________________________________  Time seen: Approximately 5:15 PM  I have reviewed the triage vital signs and the nursing notes.   HISTORY  Chief Complaint Anxiety    HPI DRUSCILLA Quinn is a 75 y.o. female history of anxiety hypertension diabetes.  Patient and her daughter both report that she's been experiencing anxiety, not sleeping well and having nausea for about the last 2 weeks, however the last 2 days she really hasn't slept much and she feels like she just can't "tense". She reports the symptoms started just after she ran out of Xanax. She reports that she has been filling her medicine regularly, and her doctor's nurse was called and they refused to refill her prescription because it was not yet time, however they think this was somehow confused with a different prescription of hers and her adamant that she is not overusing her medicine, and she is to for refill.  She has been having some loose stools over the last couple of weeks, her doctors been following her for this. She denies being in any pain. No headaches, confusion weakness dizziness. She does have generalized fatigue. Her blood sugars have been running normally.  No fevers chills. No dysuria. No chest pain or trouble breathing. She just feels generally anxious and feels like she can't sleep well at night due to anxiety. No hallucinations. No desire to want to harm herself.   Past Medical History  Diagnosis Date  . Anxiety   . Hypertension   . Asthma   . Diabetes mellitus without complication ([place])     There are no active problems to display for this patient.   Past Surgical History  Procedure Laterality Date  . Cholecystectomy    . Hernia repair      Current Outpatient Rx  Name  Route  Sig  Dispense  Refill  . ALPRAZolam (XANAX) 0.25 MG tablet   Oral   Take 1 tablet (0.25 mg total) by mouth 2 (two) times daily as needed  for anxiety.   10 tablet   0   . guaiFENesin-codeine 100-10 MG/5ML syrup   Oral   Take 5 mLs by mouth 3 (three) times daily as needed for cough.   100 mL   0   . ondansetron (ZOFRAN ODT) 4 MG disintegrating tablet   Oral   Take 1 tablet (4 mg total) by mouth every 6 (six) hours as needed for nausea or vomiting.   20 tablet   0     Allergies Penicillins  History reviewed. No pertinent family history.  Social History Social History  Substance Use Topics  . Smoking status: Never Smoker   . Smokeless tobacco: None  . Alcohol Use: No    Review of Systems Constitutional: No fever/chills Eyes: No visual changes. ENT: No sore throat. Cardiovascular: Denies chest pain. Respiratory: Denies shortness of breath. Gastrointestinal: No abdominal pain. Feeling slightly nauseated the last 2 weeks. No vomiting. Occasional loose stools for about the last 2-3 weeks. No constipation. Genitourinary: Negative for dysuria. Musculoskeletal: Negative for back pain. Skin: Negative for rash. Neurological: Negative for headaches, focal weakness or numbness.  10-point ROS otherwise negative.  ____________________________________________   PHYSICAL EXAM:  VITAL SIGNS: ED Triage Vitals  Enc Vitals Group     BP 11/19/15 1552 154/54 mmHg     Pulse Rate 11/19/15 1552 77     Resp 11/19/15 1552 18     Temp 11/19/15 1552 99.3 F (37.4 C)  Temp Source 11/19/15 1552 Oral     SpO2 11/19/15 1552 95 %     Weight 11/19/15 1552 200 lb (90.719 kg)     Height 11/19/15 1552 5\' 4"  (1.626 m)     Head Cir --      Peak Flow --      Pain Score 11/19/15 1743 2     Pain Loc --      Pain Edu? --      Excl. in [place]? --    Constitutional: Alert and oriented. Well appearing and in no acute distress. Very pleasant. Eyes: Conjunctivae are normal. PERRL. EOMI. Head: Atraumatic. Nose: No congestion/rhinnorhea. Mouth/Throat: Mucous membranes are moist.  Oropharynx non-erythematous. Neck: No stridor.    Cardiovascular: Normal rate, regular rhythm. Grossly normal heart sounds.  Good peripheral circulation. Respiratory: Normal respiratory effort.  No retractions. Lungs CTAB. Gastrointestinal: Soft and nontender. No distention.  Musculoskeletal: No lower extremity tenderness but 1+ bilateral trace edema. Neurologic:  Normal speech and language. No gross focal neurologic deficits are appreciated. Skin:  Skin is warm, dry and intact. No rash noted. Psychiatric: Mood and affect are calm and present, states she feels more anxious in the evenings. Speech and behavior are normal.  ____________________________________________   LABS (all labs ordered are listed, but only abnormal results are displayed)  Labs Reviewed  URINALYSIS COMPLETEWITH MICROSCOPIC ([place]) - Abnormal; Notable for the following:    Color, Urine YELLOW (*)    APPearance CLEAR (*)    Glucose, UA 150 (*)    Leukocytes, UA TRACE (*)    Squamous Epithelial / LPF 0-5 (*)    All other components within normal limits  GLUCOSE, CAPILLARY - Abnormal; Notable for the following:    Glucose-Capillary 149 (*)    All other components within normal limits  CBG MONITORING, ED   ____________________________________________  EKG   ____________________________________________  RADIOLOGY   ____________________________________________   PROCEDURES  Procedure(s) performed: None  Critical Care performed: No  ____________________________________________   INITIAL IMPRESSION / ASSESSMENT AND PLAN / ED COURSE  Pertinent labs & imaging results that were available during my care of the patient were reviewed by me and considered in my medical decision making (see chart for details).  Patient presents for evaluation of anxiety and feeling "tense". This appears to be closely tied to running out of her Xanax. She does report some slight nausea, but is been seeing Dr. Rebecka Apley for this and reports he is currently following her and  treating her. Her exam today is very reassuring, neurologically intact, no focal abdominal tenderness, stable vital signs. She appears well overall, but review of prescribe her database does show her last refill of her Xanax was in February which seems to correlate well with her description of running out, and I suspect that her refill was possibly inherit the PCP office as there is no indication that she recently filled a Xanax prescription.  I will provide her a very brief prescription, we'll also check a urinalysis and blood glucose.  On reassessment patient feels much better, does just have some very minimal nausea. Careful return precautions discussed, and she will be calling Dr. Paticia Stack office in the morning for follow-up. ____________________________________________   FINAL CLINICAL IMPRESSION(S) / ED DIAGNOSES  Final diagnoses:  Anxiety      Delman Kitten, MD 11/19/15 2211

## 2015-11-23 DIAGNOSIS — I10 Essential (primary) hypertension: Secondary | ICD-10-CM | POA: Diagnosis not present

## 2015-11-23 DIAGNOSIS — M1A0711 Idiopathic chronic gout, right ankle and foot, with tophus (tophi): Secondary | ICD-10-CM | POA: Diagnosis not present

## 2015-11-23 DIAGNOSIS — G20 Parkinson's disease: Secondary | ICD-10-CM | POA: Diagnosis not present

## 2015-11-23 DIAGNOSIS — F064 Anxiety disorder due to known physiological condition: Secondary | ICD-10-CM | POA: Diagnosis not present

## 2015-12-19 DIAGNOSIS — T783XXA Angioneurotic edema, initial encounter: Secondary | ICD-10-CM | POA: Diagnosis not present

## 2015-12-19 DIAGNOSIS — F064 Anxiety disorder due to known physiological condition: Secondary | ICD-10-CM | POA: Diagnosis not present

## 2015-12-19 DIAGNOSIS — E669 Obesity, unspecified: Secondary | ICD-10-CM | POA: Diagnosis not present

## 2015-12-19 DIAGNOSIS — E1140 Type 2 diabetes mellitus with diabetic neuropathy, unspecified: Secondary | ICD-10-CM | POA: Diagnosis not present

## 2016-01-19 DIAGNOSIS — E1140 Type 2 diabetes mellitus with diabetic neuropathy, unspecified: Secondary | ICD-10-CM | POA: Diagnosis not present

## 2016-01-19 DIAGNOSIS — I10 Essential (primary) hypertension: Secondary | ICD-10-CM | POA: Diagnosis not present

## 2016-01-19 DIAGNOSIS — G20 Parkinson's disease: Secondary | ICD-10-CM | POA: Diagnosis not present

## 2016-01-19 DIAGNOSIS — E669 Obesity, unspecified: Secondary | ICD-10-CM | POA: Diagnosis not present

## 2016-02-16 DIAGNOSIS — G20 Parkinson's disease: Secondary | ICD-10-CM | POA: Diagnosis not present

## 2016-02-16 DIAGNOSIS — I10 Essential (primary) hypertension: Secondary | ICD-10-CM | POA: Diagnosis not present

## 2016-02-16 DIAGNOSIS — E1140 Type 2 diabetes mellitus with diabetic neuropathy, unspecified: Secondary | ICD-10-CM | POA: Diagnosis not present

## 2016-02-16 DIAGNOSIS — M1A0711 Idiopathic chronic gout, right ankle and foot, with tophus (tophi): Secondary | ICD-10-CM | POA: Diagnosis not present

## 2016-03-15 DIAGNOSIS — I10 Essential (primary) hypertension: Secondary | ICD-10-CM | POA: Diagnosis not present

## 2016-03-15 DIAGNOSIS — M1A0711 Idiopathic chronic gout, right ankle and foot, with tophus (tophi): Secondary | ICD-10-CM | POA: Diagnosis not present

## 2016-03-15 DIAGNOSIS — E1140 Type 2 diabetes mellitus with diabetic neuropathy, unspecified: Secondary | ICD-10-CM | POA: Diagnosis not present

## 2016-03-15 DIAGNOSIS — E119 Type 2 diabetes mellitus without complications: Secondary | ICD-10-CM | POA: Diagnosis not present

## 2016-04-11 DIAGNOSIS — J0140 Acute pansinusitis, unspecified: Secondary | ICD-10-CM | POA: Diagnosis not present

## 2016-04-11 DIAGNOSIS — J399 Disease of upper respiratory tract, unspecified: Secondary | ICD-10-CM | POA: Diagnosis not present

## 2016-04-11 DIAGNOSIS — M1A0711 Idiopathic chronic gout, right ankle and foot, with tophus (tophi): Secondary | ICD-10-CM | POA: Diagnosis not present

## 2016-04-11 DIAGNOSIS — G219 Secondary parkinsonism, unspecified: Secondary | ICD-10-CM | POA: Diagnosis not present

## 2016-05-09 DIAGNOSIS — Z23 Encounter for immunization: Secondary | ICD-10-CM | POA: Diagnosis not present

## 2016-05-09 DIAGNOSIS — J399 Disease of upper respiratory tract, unspecified: Secondary | ICD-10-CM | POA: Diagnosis not present

## 2016-05-09 DIAGNOSIS — G219 Secondary parkinsonism, unspecified: Secondary | ICD-10-CM | POA: Diagnosis not present

## 2016-05-09 DIAGNOSIS — M1A0711 Idiopathic chronic gout, right ankle and foot, with tophus (tophi): Secondary | ICD-10-CM | POA: Diagnosis not present

## 2016-05-09 DIAGNOSIS — J0140 Acute pansinusitis, unspecified: Secondary | ICD-10-CM | POA: Diagnosis not present

## 2016-05-13 DIAGNOSIS — R142 Eructation: Secondary | ICD-10-CM | POA: Diagnosis not present

## 2016-05-13 DIAGNOSIS — R141 Gas pain: Secondary | ICD-10-CM | POA: Diagnosis not present

## 2016-05-13 DIAGNOSIS — M1A0711 Idiopathic chronic gout, right ankle and foot, with tophus (tophi): Secondary | ICD-10-CM | POA: Diagnosis not present

## 2016-05-13 DIAGNOSIS — R143 Flatulence: Secondary | ICD-10-CM | POA: Diagnosis not present

## 2016-05-22 IMAGING — CR DG LUMBAR SPINE COMPLETE 4+V
1 series · 5 of 5 positions shown · non-contrast
Comparison: None.

CLINICAL DATA: Low back pain for 2 weeks without injury, initial
encounter

EXAM:
LUMBAR SPINE - COMPLETE 4+ VIEW

[Series 1: dxr lumbar spine with obliques · 0.14mm/px · 5 of 5 slices shown]
[im 1/5]
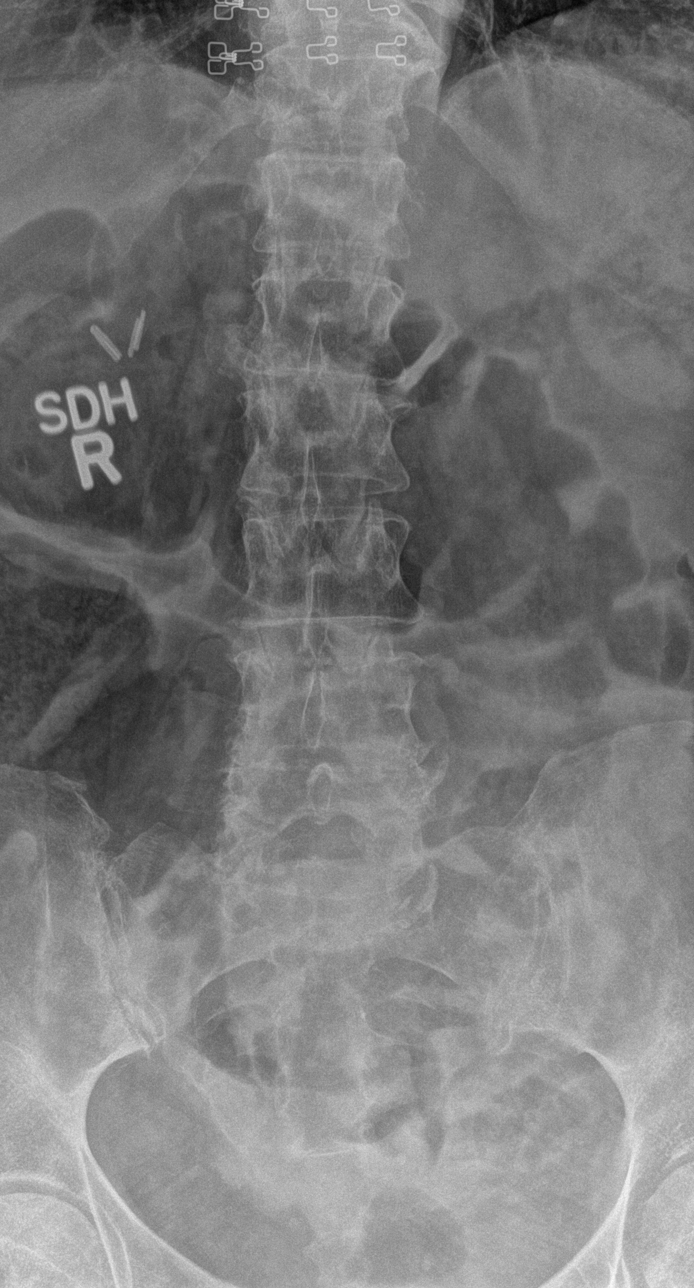
[im 2/5]
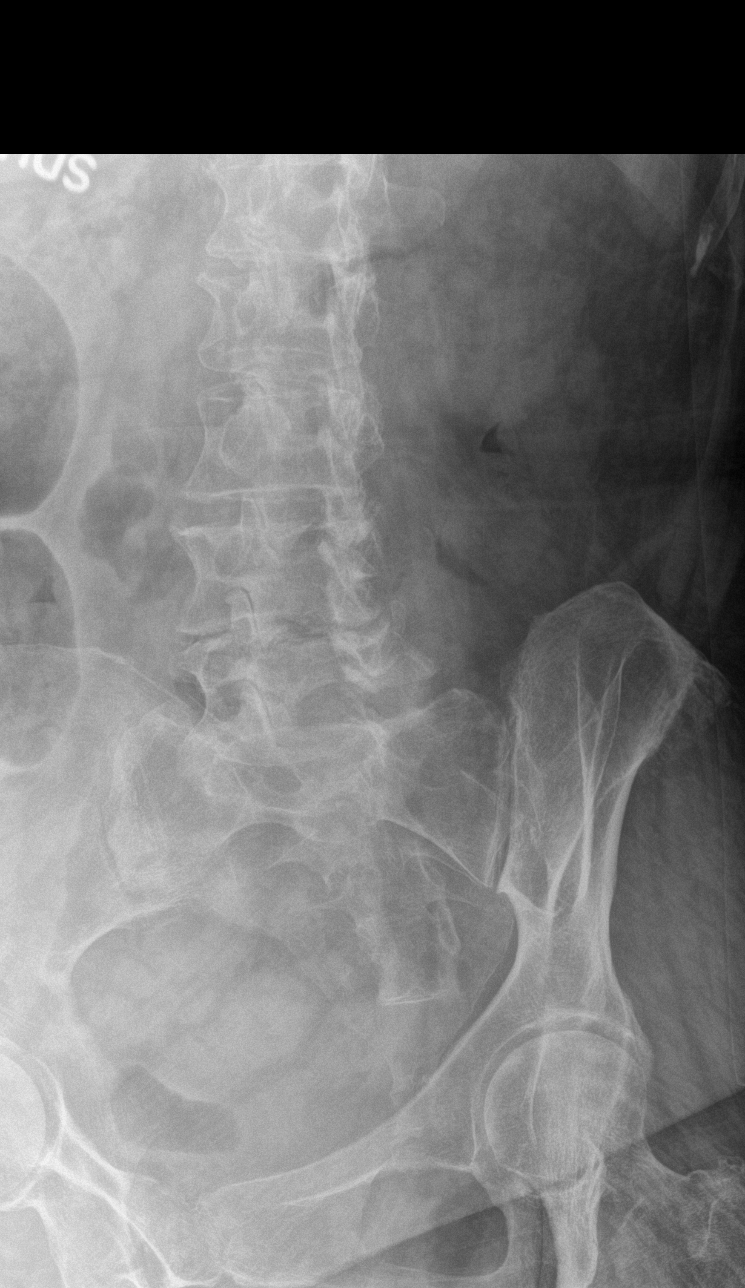
[im 3/5]
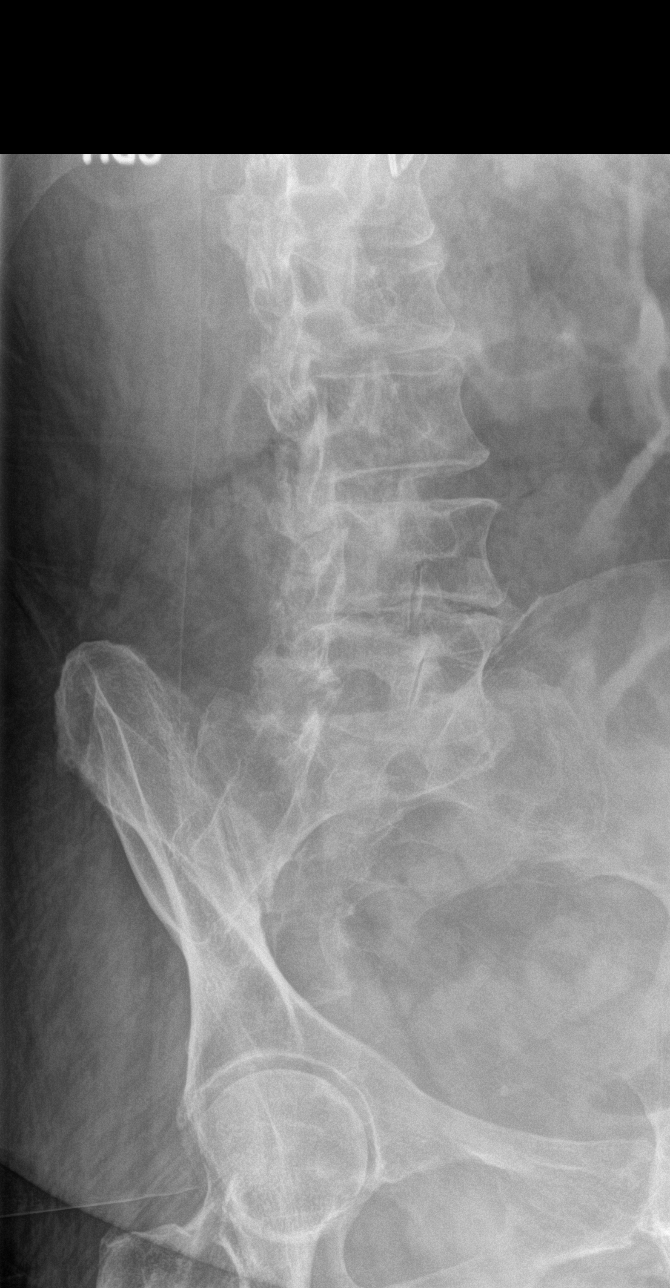
[im 4/5]
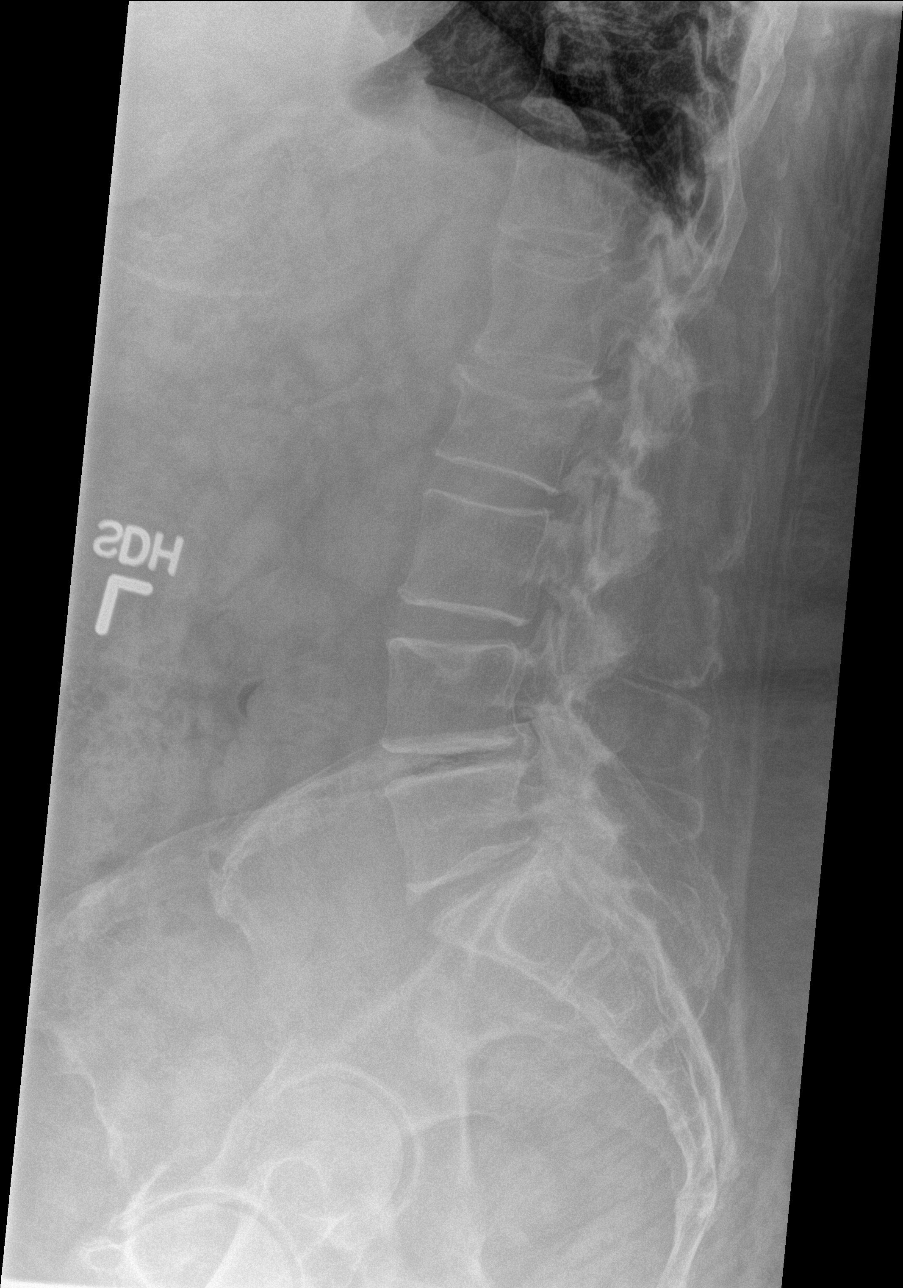
[im 5/5]
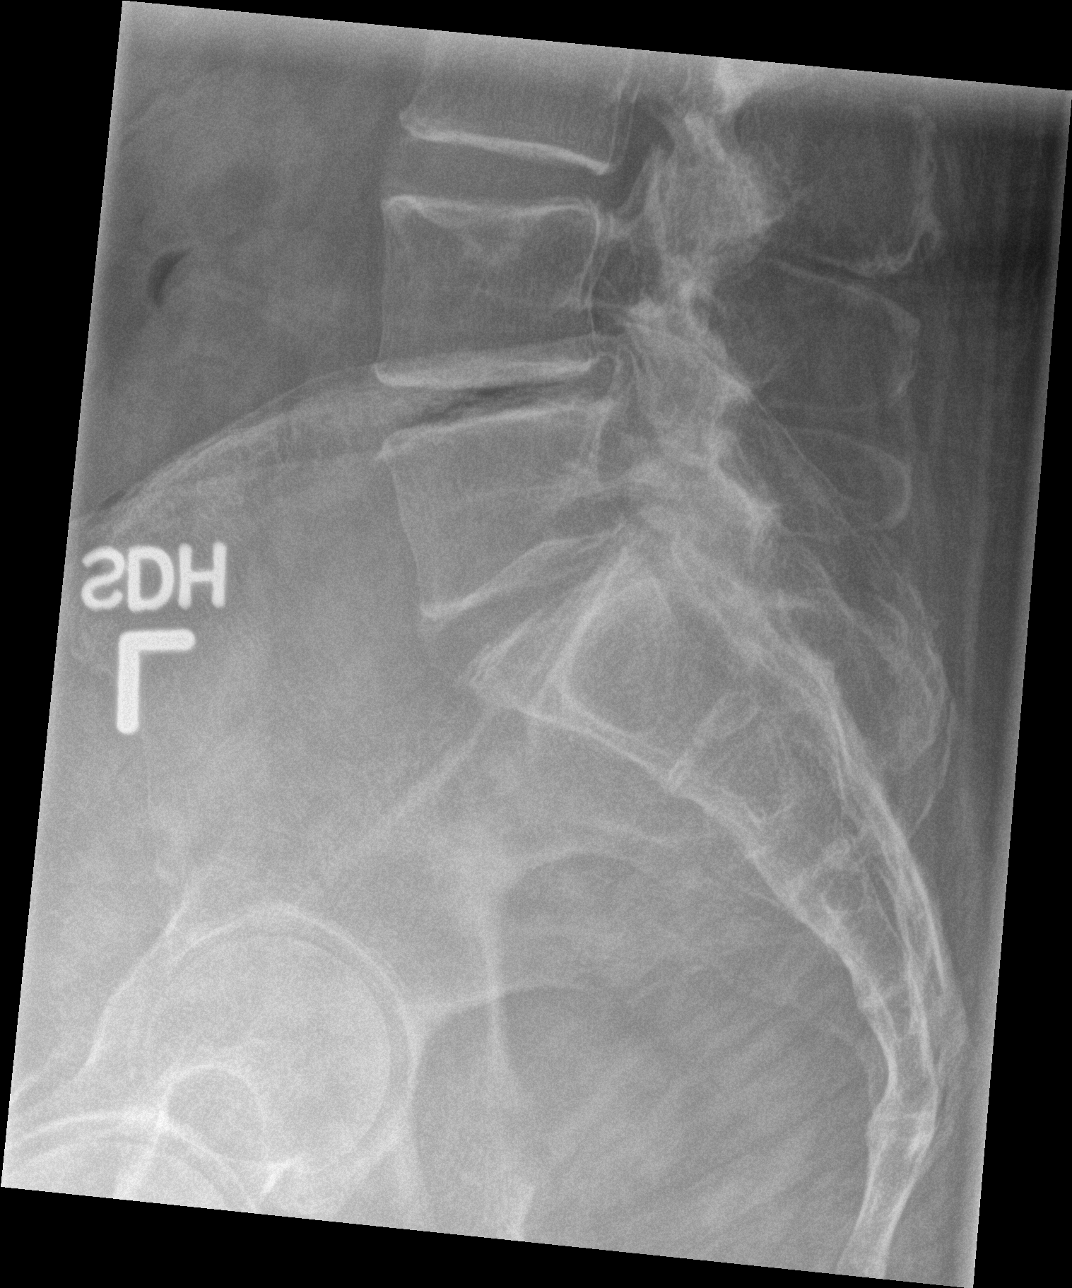

[5 of 5 positions shown; findings below may reference images not displayed]

FINDINGS: Five non rib-bearing lumbar vertebra are noted. Compression
deformity of L2 is seen of uncertain chronicity. No other
compression deformities are noted. Disc space narrowing is noted at
L4-5 and L5-S1. Facet hypertrophic changes are seen. No
anterolisthesis is noted.
IMPRESSION: L2 compression deformity of uncertain age. MRI may be helpful to
determine the degree of chronicity

## 2016-06-13 DIAGNOSIS — E119 Type 2 diabetes mellitus without complications: Secondary | ICD-10-CM | POA: Diagnosis not present

## 2016-06-13 DIAGNOSIS — E784 Other hyperlipidemia: Secondary | ICD-10-CM | POA: Diagnosis not present

## 2016-06-13 DIAGNOSIS — E1140 Type 2 diabetes mellitus with diabetic neuropathy, unspecified: Secondary | ICD-10-CM | POA: Diagnosis not present

## 2016-06-13 DIAGNOSIS — J209 Acute bronchitis, unspecified: Secondary | ICD-10-CM | POA: Diagnosis not present

## 2016-06-27 DIAGNOSIS — T783XXA Angioneurotic edema, initial encounter: Secondary | ICD-10-CM | POA: Diagnosis not present

## 2016-06-27 DIAGNOSIS — M1A0711 Idiopathic chronic gout, right ankle and foot, with tophus (tophi): Secondary | ICD-10-CM | POA: Diagnosis not present

## 2016-06-27 DIAGNOSIS — K900 Celiac disease: Secondary | ICD-10-CM | POA: Diagnosis not present

## 2016-06-27 DIAGNOSIS — F064 Anxiety disorder due to known physiological condition: Secondary | ICD-10-CM | POA: Diagnosis not present

## 2016-07-25 DIAGNOSIS — I10 Essential (primary) hypertension: Secondary | ICD-10-CM | POA: Diagnosis not present

## 2016-07-25 DIAGNOSIS — G219 Secondary parkinsonism, unspecified: Secondary | ICD-10-CM | POA: Diagnosis not present

## 2016-07-25 DIAGNOSIS — R0602 Shortness of breath: Secondary | ICD-10-CM | POA: Diagnosis not present

## 2016-07-25 DIAGNOSIS — B0802 Orf virus disease: Secondary | ICD-10-CM | POA: Diagnosis not present

## 2016-08-30 DIAGNOSIS — E784 Other hyperlipidemia: Secondary | ICD-10-CM | POA: Diagnosis not present

## 2016-08-30 DIAGNOSIS — E119 Type 2 diabetes mellitus without complications: Secondary | ICD-10-CM | POA: Diagnosis not present

## 2016-08-30 DIAGNOSIS — G20 Parkinson's disease: Secondary | ICD-10-CM | POA: Diagnosis not present

## 2016-08-30 DIAGNOSIS — F064 Anxiety disorder due to known physiological condition: Secondary | ICD-10-CM | POA: Diagnosis not present

## 2016-09-25 DIAGNOSIS — G20 Parkinson's disease: Secondary | ICD-10-CM | POA: Diagnosis not present

## 2016-09-25 DIAGNOSIS — G933 Postviral fatigue syndrome: Secondary | ICD-10-CM | POA: Diagnosis not present

## 2016-09-25 DIAGNOSIS — E1140 Type 2 diabetes mellitus with diabetic neuropathy, unspecified: Secondary | ICD-10-CM | POA: Diagnosis not present

## 2016-09-25 DIAGNOSIS — E669 Obesity, unspecified: Secondary | ICD-10-CM | POA: Diagnosis not present

## 2016-09-26 ENCOUNTER — Other Ambulatory Visit
Admission: RE | Admit: 2016-09-26 | Discharge: 2016-09-26 | Disposition: A | Discharge status: Home / Self Care | Payer: Medicare Other | Source: Ambulatory Visit | Attending: Internal Medicine | Admitting: Internal Medicine

## 2016-09-26 DIAGNOSIS — I509 Heart failure, unspecified: Secondary | ICD-10-CM | POA: Diagnosis not present

## 2016-09-26 DIAGNOSIS — E119 Type 2 diabetes mellitus without complications: Secondary | ICD-10-CM | POA: Diagnosis not present

## 2016-09-26 LAB — CBC
HCT: 35.1 % (ref 35.0–47.0)
Hemoglobin: 11.9 g/dL — ABNORMAL LOW (ref 12.0–16.0)
MCH: 28.3 pg (ref 26.0–34.0)
MCHC: 33.9 g/dL (ref 32.0–36.0)
MCV: 83.4 fL (ref 80.0–100.0)
Platelets: 341 10*3/uL (ref 150–440)
RBC: 4.21 MIL/uL (ref 3.80–5.20)
RDW: 15.1 % — ABNORMAL HIGH (ref 11.5–14.5)
WBC: 7.8 10*3/uL (ref 3.6–11.0)

## 2016-09-26 LAB — BASIC METABOLIC PANEL
Anion gap: 13 (ref 5–15)
BUN: 33 mg/dL — ABNORMAL HIGH (ref 6–20)
CO2: 30 mmol/L (ref 22–32)
Calcium: 9 mg/dL (ref 8.9–10.3)
Chloride: 94 mmol/L — ABNORMAL LOW (ref 101–111)
Creatinine, Ser: 1.59 mg/dL — ABNORMAL HIGH (ref 0.44–1.00)
GFR calc Af Amer: 36 mL/min — ABNORMAL LOW (ref >60)
GFR calc non Af Amer: 31 mL/min — ABNORMAL LOW (ref >60)
Glucose, Bld: 111 mg/dL — ABNORMAL HIGH (ref 65–99)
Potassium: 3.2 mmol/L — ABNORMAL LOW (ref 3.5–5.1)
Sodium: 137 mmol/L (ref 135–145)

## 2016-09-27 DIAGNOSIS — G20 Parkinson's disease: Secondary | ICD-10-CM | POA: Diagnosis not present

## 2016-09-27 DIAGNOSIS — I10 Essential (primary) hypertension: Secondary | ICD-10-CM | POA: Diagnosis not present

## 2016-09-27 DIAGNOSIS — T783XXA Angioneurotic edema, initial encounter: Secondary | ICD-10-CM | POA: Diagnosis not present

## 2016-09-27 DIAGNOSIS — J209 Acute bronchitis, unspecified: Secondary | ICD-10-CM | POA: Diagnosis not present

## 2016-10-03 DIAGNOSIS — A09 Infectious gastroenteritis and colitis, unspecified: Secondary | ICD-10-CM | POA: Diagnosis not present

## 2016-10-03 DIAGNOSIS — E1140 Type 2 diabetes mellitus with diabetic neuropathy, unspecified: Secondary | ICD-10-CM | POA: Diagnosis not present

## 2016-10-03 DIAGNOSIS — E119 Type 2 diabetes mellitus without complications: Secondary | ICD-10-CM | POA: Diagnosis not present

## 2016-10-03 DIAGNOSIS — K589 Irritable bowel syndrome without diarrhea: Secondary | ICD-10-CM | POA: Diagnosis not present

## 2016-10-08 DIAGNOSIS — M1A0711 Idiopathic chronic gout, right ankle and foot, with tophus (tophi): Secondary | ICD-10-CM | POA: Diagnosis not present

## 2016-10-08 DIAGNOSIS — E119 Type 2 diabetes mellitus without complications: Secondary | ICD-10-CM | POA: Diagnosis not present

## 2016-10-08 DIAGNOSIS — I10 Essential (primary) hypertension: Secondary | ICD-10-CM | POA: Diagnosis not present

## 2016-10-08 DIAGNOSIS — R0602 Shortness of breath: Secondary | ICD-10-CM | POA: Diagnosis not present

## 2016-11-05 DIAGNOSIS — M1A0711 Idiopathic chronic gout, right ankle and foot, with tophus (tophi): Secondary | ICD-10-CM | POA: Diagnosis not present

## 2016-11-05 DIAGNOSIS — G20 Parkinson's disease: Secondary | ICD-10-CM | POA: Diagnosis not present

## 2016-11-05 DIAGNOSIS — B0802 Orf virus disease: Secondary | ICD-10-CM | POA: Diagnosis not present

## 2016-11-05 DIAGNOSIS — E669 Obesity, unspecified: Secondary | ICD-10-CM | POA: Diagnosis not present

## 2016-11-14 DIAGNOSIS — M1A0711 Idiopathic chronic gout, right ankle and foot, with tophus (tophi): Secondary | ICD-10-CM | POA: Diagnosis not present

## 2016-11-14 DIAGNOSIS — J45909 Unspecified asthma, uncomplicated: Secondary | ICD-10-CM | POA: Diagnosis not present

## 2016-11-14 DIAGNOSIS — J209 Acute bronchitis, unspecified: Secondary | ICD-10-CM | POA: Diagnosis not present

## 2016-11-14 DIAGNOSIS — G20 Parkinson's disease: Secondary | ICD-10-CM | POA: Diagnosis not present

## 2016-11-28 DIAGNOSIS — I10 Essential (primary) hypertension: Secondary | ICD-10-CM | POA: Diagnosis not present

## 2016-11-28 DIAGNOSIS — G20 Parkinson's disease: Secondary | ICD-10-CM | POA: Diagnosis not present

## 2016-11-28 DIAGNOSIS — R0602 Shortness of breath: Secondary | ICD-10-CM | POA: Diagnosis not present

## 2016-11-28 DIAGNOSIS — E669 Obesity, unspecified: Secondary | ICD-10-CM | POA: Diagnosis not present

## 2017-01-07 DIAGNOSIS — J209 Acute bronchitis, unspecified: Secondary | ICD-10-CM | POA: Diagnosis not present

## 2017-01-07 DIAGNOSIS — J441 Chronic obstructive pulmonary disease with (acute) exacerbation: Secondary | ICD-10-CM | POA: Diagnosis not present

## 2017-01-07 DIAGNOSIS — Z1389 Encounter for screening for other disorder: Secondary | ICD-10-CM | POA: Diagnosis not present

## 2017-01-07 DIAGNOSIS — R143 Flatulence: Secondary | ICD-10-CM | POA: Diagnosis not present

## 2017-01-07 DIAGNOSIS — J45909 Unspecified asthma, uncomplicated: Secondary | ICD-10-CM | POA: Diagnosis not present

## 2017-02-04 DIAGNOSIS — I10 Essential (primary) hypertension: Secondary | ICD-10-CM | POA: Diagnosis not present

## 2017-02-04 DIAGNOSIS — F064 Anxiety disorder due to known physiological condition: Secondary | ICD-10-CM | POA: Diagnosis not present

## 2017-02-04 DIAGNOSIS — G219 Secondary parkinsonism, unspecified: Secondary | ICD-10-CM | POA: Diagnosis not present

## 2017-02-04 DIAGNOSIS — E669 Obesity, unspecified: Secondary | ICD-10-CM | POA: Diagnosis not present

## 2017-03-04 DIAGNOSIS — I10 Essential (primary) hypertension: Secondary | ICD-10-CM | POA: Diagnosis not present

## 2017-03-04 DIAGNOSIS — G219 Secondary parkinsonism, unspecified: Secondary | ICD-10-CM | POA: Diagnosis not present

## 2017-03-04 DIAGNOSIS — F064 Anxiety disorder due to known physiological condition: Secondary | ICD-10-CM | POA: Diagnosis not present

## 2017-03-04 DIAGNOSIS — E1140 Type 2 diabetes mellitus with diabetic neuropathy, unspecified: Secondary | ICD-10-CM | POA: Diagnosis not present

## 2017-03-25 DIAGNOSIS — R202 Paresthesia of skin: Secondary | ICD-10-CM | POA: Diagnosis not present

## 2017-03-25 DIAGNOSIS — R200 Anesthesia of skin: Secondary | ICD-10-CM | POA: Diagnosis not present

## 2017-03-25 DIAGNOSIS — R251 Tremor, unspecified: Secondary | ICD-10-CM | POA: Diagnosis not present

## 2017-05-06 DIAGNOSIS — G20 Parkinson's disease: Secondary | ICD-10-CM | POA: Diagnosis not present

## 2017-05-06 DIAGNOSIS — Z23 Encounter for immunization: Secondary | ICD-10-CM | POA: Diagnosis not present

## 2017-05-06 DIAGNOSIS — T428X5A Adverse effect of antiparkinsonism drugs and other central muscle-tone depressants, initial encounter: Secondary | ICD-10-CM | POA: Diagnosis not present

## 2017-05-06 DIAGNOSIS — J029 Acute pharyngitis, unspecified: Secondary | ICD-10-CM | POA: Diagnosis not present

## 2017-05-06 DIAGNOSIS — T4275XA Adverse effect of unspecified antiepileptic and sedative-hypnotic drugs, initial encounter: Secondary | ICD-10-CM | POA: Diagnosis not present

## 2017-07-19 ENCOUNTER — Emergency Department
Admission: EM | Admit: 2017-07-19 | Discharge: 2017-07-20 | Disposition: A | Discharge status: Home / Self Care | Payer: 59 | Attending: Emergency Medicine | Admitting: Emergency Medicine

## 2017-07-19 ENCOUNTER — Encounter: Payer: Self-pay | Admitting: Emergency Medicine

## 2017-07-19 ENCOUNTER — Other Ambulatory Visit: Payer: Self-pay

## 2017-07-19 DIAGNOSIS — R197 Diarrhea, unspecified: Secondary | ICD-10-CM | POA: Diagnosis not present

## 2017-07-19 DIAGNOSIS — Z79899 Other long term (current) drug therapy: Secondary | ICD-10-CM | POA: Diagnosis not present

## 2017-07-19 DIAGNOSIS — J45909 Unspecified asthma, uncomplicated: Secondary | ICD-10-CM | POA: Diagnosis not present

## 2017-07-19 DIAGNOSIS — N390 Urinary tract infection, site not specified: Secondary | ICD-10-CM | POA: Insufficient documentation

## 2017-07-19 DIAGNOSIS — I10 Essential (primary) hypertension: Secondary | ICD-10-CM | POA: Insufficient documentation

## 2017-07-19 DIAGNOSIS — E876 Hypokalemia: Secondary | ICD-10-CM | POA: Diagnosis not present

## 2017-07-19 DIAGNOSIS — E119 Type 2 diabetes mellitus without complications: Secondary | ICD-10-CM | POA: Insufficient documentation

## 2017-07-19 DIAGNOSIS — Z7984 Long term (current) use of oral hypoglycemic drugs: Secondary | ICD-10-CM | POA: Insufficient documentation

## 2017-07-19 HISTORY — DX: Personal history of other infectious and parasitic diseases: Z86.19

## 2017-07-19 NOTE — ED Triage Notes (Signed)
Patient presents to Emergency Department via EMS from home with complaints of diarrhea for 1 day with 4-5 episodes tonight with nausea; pt denies vomiting, SOB.  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  Pt reports using peptobismol and imodium without positive effects.

## 2017-07-20 ENCOUNTER — Encounter: Payer: Self-pay | Admitting: Emergency Medicine

## 2017-07-20 LAB — CBC
HCT: 36.7 % (ref 35.0–47.0)
Hemoglobin: 12.1 g/dL (ref 12.0–16.0)
MCH: 28.9 pg (ref 26.0–34.0)
MCHC: 33 g/dL (ref 32.0–36.0)
MCV: 87.8 fL (ref 80.0–100.0)
Platelets: 406 10*3/uL (ref 150–440)
RBC: 4.18 MIL/uL (ref 3.80–5.20)
RDW: 14.7 % — AB (ref 11.5–14.5)
WBC: 11.9 10*3/uL — ABNORMAL HIGH (ref 3.6–11.0)

## 2017-07-20 LAB — COMPREHENSIVE METABOLIC PANEL
ALBUMIN: 3.7 g/dL (ref 3.5–5.0)
ALK PHOS: 59 U/L (ref 38–126)
ALT: 5 U/L — ABNORMAL LOW (ref 14–54)
AST: 20 U/L (ref 15–41)
Anion gap: 14 (ref 5–15)
BILIRUBIN TOTAL: 0.3 mg/dL (ref 0.3–1.2)
BUN: 28 mg/dL — AB (ref 6–20)
CALCIUM: 8.4 mg/dL — AB (ref 8.9–10.3)
CO2: 25 mmol/L (ref 22–32)
CREATININE: 1.61 mg/dL — AB (ref 0.44–1.00)
Chloride: 96 mmol/L — ABNORMAL LOW (ref 101–111)
GFR calc Af Amer: 35 mL/min — ABNORMAL LOW (ref >60)
GFR calc non Af Amer: 30 mL/min — ABNORMAL LOW (ref >60)
GLUCOSE: 144 mg/dL — AB (ref 65–99)
Potassium: 2.9 mmol/L — ABNORMAL LOW (ref 3.5–5.1)
Sodium: 135 mmol/L (ref 135–145)
TOTAL PROTEIN: 7.8 g/dL (ref 6.5–8.1)

## 2017-07-20 LAB — URINALYSIS, COMPLETE (UACMP) WITH MICROSCOPIC
Bilirubin Urine: NEGATIVE
GLUCOSE, UA: NEGATIVE mg/dL
HGB URINE DIPSTICK: NEGATIVE
Ketones, ur: NEGATIVE mg/dL
NITRITE: POSITIVE — AB
Protein, ur: NEGATIVE mg/dL
SPECIFIC GRAVITY, URINE: 1.015 (ref 1.005–1.030)
pH: 5 (ref 5.0–8.0)

## 2017-07-20 LAB — LIPASE, BLOOD: Lipase: 31 U/L (ref 11–51)

## 2017-07-20 MED ORDER — CEPHALEXIN 500 MG PO CAPS: 500.0000 mg | ORAL_CAPSULE | Freq: Two times a day (BID) | ORAL | 0 refills | Status: DC

## 2017-07-20 MED ORDER — POTASSIUM CHLORIDE 10 MEQ/100ML IV SOLN: 10.0000 meq | Freq: Once | INTRAVENOUS | Status: DC

## 2017-07-20 MED ORDER — POTASSIUM CHLORIDE 10 MEQ/100ML IV SOLN
10.0000 meq | INTRAVENOUS | Status: AC
  Administered 2017-07-20: 10 meq via INTRAVENOUS
  Filled 2017-07-20 (×2): qty 100

## 2017-07-20 MED ORDER — CEFTRIAXONE SODIUM IN DEXTROSE 20 MG/ML IV SOLN
1.0000 g | INTRAVENOUS | Status: AC
  Administered 2017-07-20: 1 g via INTRAVENOUS
  Filled 2017-07-20: qty 50

## 2017-07-20 MED ORDER — POTASSIUM CHLORIDE 20 MEQ PO PACK
40.0000 meq | PACK | ORAL | Status: AC
  Administered 2017-07-20: 40 meq via ORAL
  Filled 2017-07-20: qty 2

## 2017-07-20 MED ORDER — POTASSIUM CHLORIDE CRYS ER 20 MEQ PO TBCR: 20.0000 meq | EXTENDED_RELEASE_TABLET | Freq: Every day | ORAL | 0 refills | Status: DC

## 2017-07-20 MED ORDER — SODIUM CHLORIDE 0.9 % IV BOLUS (SEPSIS)
500.0000 mL | INTRAVENOUS | Status: AC
  Administered 2017-07-20: 500 mL via INTRAVENOUS

## 2017-07-20 MED ORDER — ONDANSETRON 4 MG PO TBDP: ORAL_TABLET | ORAL | 0 refills | Status: DC

## 2017-07-20 NOTE — ED Notes (Signed)
Pt still unable to have BM. MD Karma Greaser aware.

## 2017-07-20 NOTE — ED Notes (Signed)
Pt unable to deficate

## 2017-07-20 NOTE — ED Provider Notes (Signed)
[facility] Emergency Department Provider Note  ____________________________________________   First MD Initiated Contact with Patient 07/20/17 [phone]     (approximate)  I have reviewed the triage vital signs and the nursing notes.   HISTORY  Chief Complaint Diarrhea    HPI Sabrina Quinn is a 76 y.o. female with medical history as listed below who presents for evaluation of 4-5 episodes of diarrhea a day for about 24 hours.  She had some diarrhea earlier in the week but then it seemed to get better for a couple of days.  Today it was severe and by tonight she was having uncontrollable bowel incontinence; she and her daughter state that whenever she stood up, liquid stool would fall out.  She denies any pain and also denies fever/chills, chest pain, shortness of breath, abdominal pain, and dysuria.  Nothing makes her symptoms better including Pepto-Bismol and Imodium, and standing up and trying to walk around seem to make it worse.  Eating food also seems to cause her to have bowel urgency.  She has not been on antibiotics recently.  She started on colchicine for gout earlier in the week and the diarrhea started immediately afterwards, so her daughter has not given her any more colchicine for the last few days.  Past Medical History:  Diagnosis Date  . Anxiety   . Asthma   . Diabetes mellitus without complication ([place])   . H/O Clostridium difficile infection   . Hypertension     There are no active problems to display for this patient.   Past Surgical History:  Procedure Laterality Date  . CHOLECYSTECTOMY    . HERNIA REPAIR      Prior to Admission medications   Medication Sig Start Date End Date Taking? Authorizing Provider  ADVAIR DISKUS 250-50 MCG/DOSE AEPB Inhale 1 puff into the lungs 2 (two) times daily.    [provider]  ALPRAZolam Duanne Moron) 0.25 MG tablet Take 0.25 mg by mouth at bedtime as needed for sleep.    [provider]    amiloride-hydrochlorothiazide (MODURETIC) 5-50 MG tablet Take 1 tablet by mouth daily.    [provider]  atorvastatin (LIPITOR) 20 MG tablet Take 20 mg by mouth daily.    [provider]  carbidopa-levodopa (SINEMET IR) 25-100 MG tablet Take 1 tablet by mouth 3 (three) times daily.    [provider]  cephALEXin (KEFLEX) 500 MG capsule Take 1 capsule (500 mg total) by mouth 2 (two) times daily. 07/20/17   Hinda Kehr, MD  citalopram (CELEXA) 20 MG tablet Take 20 mg by mouth daily.    [provider]  diltiazem (TIAZAC) 120 MG 24 hr capsule Take 1 capsule by mouth daily.    [provider]  furosemide (LASIX) 20 MG tablet Take 20 mg by mouth daily.    [provider]  gabapentin (NEURONTIN) 300 MG capsule Take 300 mg by mouth 3 (three) times daily.    [provider]  guaiFENesin-codeine 100-10 MG/5ML syrup Take 5 mLs by mouth 3 (three) times daily as needed for cough. 09/03/15   Johnn Hai, PA-C  metFORMIN (GLUCOPHAGE) 500 MG tablet Take 500 mg by mouth 2 (two) times daily.    [provider]  montelukast (SINGULAIR) 10 MG tablet Take 10 mg by mouth at bedtime.    [provider]  omeprazole (PRILOSEC) 20 MG capsule Take 20 mg by mouth daily.    [provider]  ondansetron (ZOFRAN ODT) 4  MG disintegrating tablet Allow 1-2 tablets to dissolve in your mouth every 8 hours as needed for nausea/vomiting 07/20/17   Hinda Kehr, MD  potassium chloride SA (KLOR-CON M20) 20 MEQ tablet Take 1 tablet (20 mEq total) by mouth daily. 07/20/17   Hinda Kehr, MD    Allergies Penicillins  History reviewed. No pertinent family history.  Social History Social History   Tobacco Use  . Smoking status: Never Smoker  . Smokeless tobacco: Never Used  Substance Use Topics  . Alcohol use: No  . Drug use: No    Review of Systems Constitutional: No fever/chills Eyes: No visual changes. ENT: No sore  throat. Cardiovascular: Denies chest pain. Respiratory: Denies shortness of breath. Gastrointestinal: No abdominal pain but severe diarrhea for the last 24 hours including bowel incontinence. .  No nausea, no vomiting.   Genitourinary: Negative for dysuria. Musculoskeletal: Negative for neck pain.  Negative for back pain. Integumentary: Negative for rash. Neurological: Negative for headaches, focal weakness or numbness.   ____________________________________________   PHYSICAL EXAM:  VITAL SIGNS: ED Triage Vitals  Enc Vitals Group     BP 07/19/17 2352 137/86     Pulse Rate 07/19/17 2352 78     Resp 07/19/17 2352 18     Temp 07/19/17 2352 98.8 F (37.1 C)     Temp Source 07/19/17 2352 Oral     SpO2 07/19/17 2352 99 %     Weight 07/19/17 2353 96.6 kg (213 lb)     Height 07/19/17 2353 1.626 m (5\' 4" )     Head Circumference --      Peak Flow --      Pain Score --      Pain Loc --      Pain Edu? --      Excl. in [place]? --     Constitutional: Alert and oriented. Well appearing and in no acute distress. Eyes: Conjunctivae are normal.  Head: Atraumatic. Nose: No congestion/rhinnorhea. Mouth/Throat: Mucous membranes are moist. Neck: No stridor.  No meningeal signs.   Cardiovascular: Normal rate, regular rhythm. Good peripheral circulation. Grossly normal heart sounds. Respiratory: Normal respiratory effort.  No retractions. Lungs CTAB. Gastrointestinal: Obese. Soft and nontender. No distention.  Musculoskeletal: No lower extremity tenderness nor edema. No gross deformities of extremities. Neurologic:  Normal speech and language. No gross focal neurologic deficits are appreciated.  Skin:  Skin is warm, dry and intact. No rash noted. Psychiatric: Mood and affect are normal. Speech and behavior are normal.  ____________________________________________   LABS (all labs ordered are listed, but only abnormal results are displayed)  Labs Reviewed  COMPREHENSIVE METABOLIC PANEL -  Abnormal; Notable for the following components:      Result Value   Potassium 2.9 (*)    Chloride 96 (*)    Glucose, Bld 144 (*)    BUN 28 (*)    Creatinine, Ser 1.61 (*)    Calcium 8.4 (*)    ALT 5 (*)    GFR calc non Af Amer 30 (*)    GFR calc Af Amer 35 (*)    All other components within normal limits  CBC - Abnormal; Notable for the following components:   WBC 11.9 (*)    RDW 14.7 (*)    All other components within normal limits  URINALYSIS, COMPLETE (UACMP) WITH MICROSCOPIC - Abnormal; Notable for the following components:   Color, Urine YELLOW (*)    APPearance CLOUDY (*)    Nitrite POSITIVE (*)  Leukocytes, UA LARGE (*)    Bacteria, UA MANY (*)    Squamous Epithelial / LPF 0-5 (*)    All other components within normal limits  GASTROINTESTINAL PANEL BY PCR, STOOL (REPLACES STOOL CULTURE)  C DIFFICILE QUICK SCREEN W PCR REFLEX  URINE CULTURE  LIPASE, BLOOD   ____________________________________________  EKG  None - EKG not ordered by ED physician ____________________________________________  RADIOLOGY   No results found.  ____________________________________________   PROCEDURES  Critical Care performed: No   Procedure(s) performed:   Procedures   ____________________________________________   INITIAL IMPRESSION / ASSESSMENT AND PLAN / ED COURSE  As part of my medical decision making, I reviewed the following data within the [place] History obtained from family, Nursing notes reviewed and incorporated and Labs reviewed     Differential diagnosis includes, but is not limited to, diarrhea of infectious origin including both bacterial and viral sources, medication side effect from colchicine, C. difficile  Colitis, diverticulosis/diverticulitis, etc. I think that diverticulitis is very unlikely given that she has no abdominal pain.  Community-acquired C. difficile is possible.  Her lab work is notable for a very mild  leukocytosis, normal lipase, creatinine of about 1.6 which seems to be her baseline, and significant hypokalemia at 2.9.  I will begin potassium replacement with oral and IV supplements and give her a small fluid bolus.  Her disposition will be determined by her ability to give [place] a stool sample and tolerate p.o. fluids as well as controlling her bowels.  She has not yet been able to give a stool sample in spite of at least one attempt and walking around the room which is encouraging.  I explained to her and her daughter that if she is not able to provide a stool sample we should be able to replete her potassium and have her follow-up as an outpatient.  It would be nice to rule out an acute infectious cause including C. difficile.  I will give her something to eat to help encourage a bowel movement.  She and the daughter agree with the plan.  Clinical Course as of Jul 21 507  Nancy Fetter Jul 20, 2017  0320 Patient still has not been able to provide a stool sample after nearly 4 hours in the emergency department and after eating food and ambulating.  She has very obvious urinary tract infection for which I am treating with ceftriaxone.  At the point that her antibiotics are done if she has still not had any more diarrhea with antibiotics for her UTI and recommend close outpatient follow-up.  She agrees with this plan.  [CF]  0507 Patient still has not been able to produce a stool sample so I will discharge her for outpatient follow-up.  I gave my usual and customary return precautions.  She is tolerating oral intake including both liquids and solid food and is in no acute distress.  [CF]    Clinical Course User Index [CF] Hinda Kehr, MD    ____________________________________________  FINAL CLINICAL IMPRESSION(S) / ED DIAGNOSES  Final diagnoses:  Diarrhea, unspecified type  Hypokalemia  Urinary tract infection without hematuria, site unspecified     MEDICATIONS GIVEN DURING THIS  VISIT:  Medications  potassium chloride 10 mEq in 100 mL IVPB (0 mEq Intravenous Stopped 07/20/17 0430)  potassium chloride (KLOR-CON) packet 40 mEq (40 mEq Oral Given 07/20/17 0136)  sodium chloride 0.9 % bolus 500 mL (0 mLs Intravenous Stopped 07/20/17 0438)  cefTRIAXone (ROCEPHIN) 1 g in  dextrose 5 % 50 mL IVPB - Premix (1 g Intravenous New Bag/Given 07/20/17 0435)     ED Discharge Orders        Ordered    cephALEXin (KEFLEX) 500 MG capsule  2 times daily     07/20/17 0506    ondansetron (ZOFRAN ODT) 4 MG disintegrating tablet     07/20/17 0506    potassium chloride SA (KLOR-CON M20) 20 MEQ tablet  Daily     07/20/17 0506       Note:  This document was prepared using Dragon voice recognition software and may include unintentional dictation errors.    Hinda Kehr, MD 07/20/17 [phone]

## 2017-07-20 NOTE — Discharge Instructions (Signed)
You were not able to provide a stool specimen today, but your lab work showed that your potassium is low (likely from the diarrhea) and that you have a urinary tract infection.  We began treatment with antibiotics (ceftriaxone 1 g IV) and have written a prescription for you which she should complete (do not stop taking the pills until they are gone).  Please read through the included information and make sure to stay hydrated.  Please also take the potassium supplement as written and follow-up with your primary care doctor for some repeat lab work preferably within the next few days to make sure your potassium level is coming up after supplementation.  Return to the emergency department if you develop new or worsening symptoms that concern you.

## 2017-07-20 NOTE — ED Notes (Signed)
Pt given graham crackers and PB per MD.

## 2017-07-24 LAB — URINE CULTURE: SPECIAL REQUESTS: NORMAL

## 2018-01-19 ENCOUNTER — Emergency Department
Admission: EM | Admit: 2018-01-19 | Discharge: 2018-01-19 | Disposition: A | Discharge status: Home / Self Care | Payer: Medicare Other | Attending: Emergency Medicine | Admitting: Emergency Medicine

## 2018-01-19 ENCOUNTER — Encounter: Payer: Self-pay | Admitting: Emergency Medicine

## 2018-01-19 ENCOUNTER — Emergency Department: Payer: Medicare Other

## 2018-01-19 ENCOUNTER — Other Ambulatory Visit: Payer: Self-pay

## 2018-01-19 DIAGNOSIS — E119 Type 2 diabetes mellitus without complications: Secondary | ICD-10-CM | POA: Insufficient documentation

## 2018-01-19 DIAGNOSIS — I10 Essential (primary) hypertension: Secondary | ICD-10-CM | POA: Diagnosis not present

## 2018-01-19 DIAGNOSIS — J40 Bronchitis, not specified as acute or chronic: Secondary | ICD-10-CM

## 2018-01-19 DIAGNOSIS — R05 Cough: Secondary | ICD-10-CM | POA: Diagnosis not present

## 2018-01-19 DIAGNOSIS — Z7984 Long term (current) use of oral hypoglycemic drugs: Secondary | ICD-10-CM | POA: Insufficient documentation

## 2018-01-19 DIAGNOSIS — Z79899 Other long term (current) drug therapy: Secondary | ICD-10-CM | POA: Diagnosis not present

## 2018-01-19 MED ORDER — METHYLPREDNISOLONE SODIUM SUCC 125 MG IJ SOLR
125.0000 mg | Freq: Once | INTRAMUSCULAR | Status: AC
  Administered 2018-01-19: 125 mg via INTRAMUSCULAR
  Filled 2018-01-19: qty 2

## 2018-01-19 MED ORDER — FLUTICASONE PROPIONATE 50 MCG/ACT NA SUSP: 1.0000 | Freq: Two times a day (BID) | NASAL | 0 refills | Status: DC

## 2018-01-19 MED ORDER — ALBUTEROL SULFATE (2.5 MG/3ML) 0.083% IN NEBU
2.5000 mg | INHALATION_SOLUTION | Freq: Once | RESPIRATORY_TRACT | Status: AC
  Administered 2018-01-19: 2.5 mg via RESPIRATORY_TRACT
  Filled 2018-01-19: qty 3

## 2018-01-19 MED ORDER — CETIRIZINE HCL 10 MG PO TABS: 10.0000 mg | ORAL_TABLET | Freq: Every day | ORAL | 0 refills | Status: DC

## 2018-01-19 MED ORDER — PREDNISONE 50 MG PO TABS: 50.0000 mg | ORAL_TABLET | Freq: Every day | ORAL | 0 refills | Status: DC

## 2018-01-19 MED ORDER — ALBUTEROL SULFATE HFA 108 (90 BASE) MCG/ACT IN AERS: 2.0000 | INHALATION_SPRAY | RESPIRATORY_TRACT | 0 refills | Status: DC | PRN

## 2018-01-19 NOTE — ED Provider Notes (Signed)
[facility] Emergency Department Provider Note  ____________________________________________  Time seen: Approximately 5:32 PM  I have reviewed the triage vital signs and the nursing notes.   HISTORY  Chief Complaint Cough    HPI Sabrina Quinn is a 77 y.o. female who presents the emergency department complaining of wheezing, increased coughing, mild shortness of breath.  Patient reports that she has chronic asthma which she takes Advair for.  Patient reports that she does not have a pulmonologist.  Patient reports that she has daily coughing, shortness of breath.  She reports that it is slightly worse than normal.  Patient reports that she has multiple episodes of bronchitis throughout the year and she feels that this is consistent with previous bronchitis episodes.  She denies any fevers or chills.  She reports that the cough is productive but this is baseline.  She denies ever being diagnosed with COPD or emphysema.  Patient does also experience seasonal allergic rhinitis symptoms but does not take any medication over-the-counter for same.  Currently patient denies any fevers or chills, headache, visual changes, neck pain or stiffness, difficulty breathing or swallowing, chest pain, abdominal pain, nausea or vomiting.  Past Medical History:  Diagnosis Date  . Anxiety   . Asthma   . Diabetes mellitus without complication ([place])   . H/O Clostridium difficile infection   . Hypertension     There are no active problems to display for this patient.   Past Surgical History:  Procedure Laterality Date  . CHOLECYSTECTOMY    . HERNIA REPAIR      Prior to Admission medications   Medication Sig Start Date End Date Taking? Authorizing Provider  ADVAIR DISKUS 250-50 MCG/DOSE AEPB Inhale 1 puff into the lungs 2 (two) times daily.    [provider]  albuterol (PROVENTIL HFA;VENTOLIN HFA) 108 (90 Base) MCG/ACT inhaler Inhale 2 puffs into the lungs every 4  (four) hours as needed for wheezing or shortness of breath. 01/19/18   Dragon Thrush, Charline Bills, PA-C  ALPRAZolam Duanne Moron) 0.25 MG tablet Take 0.25 mg by mouth at bedtime as needed for sleep.    [provider]  amiloride-hydrochlorothiazide (MODURETIC) 5-50 MG tablet Take 1 tablet by mouth daily.    [provider]  atorvastatin (LIPITOR) 20 MG tablet Take 20 mg by mouth daily.    [provider]  carbidopa-levodopa (SINEMET IR) 25-100 MG tablet Take 1 tablet by mouth 3 (three) times daily.    [provider]  cetirizine (ZYRTEC) 10 MG tablet Take 1 tablet (10 mg total) by mouth daily. 01/19/18   Priscila Bean, Charline Bills, PA-C  citalopram (CELEXA) 20 MG tablet Take 20 mg by mouth daily.    [provider]  diltiazem (TIAZAC) 120 MG 24 hr capsule Take 1 capsule by mouth daily.    [provider]  fluticasone (FLONASE) 50 MCG/ACT nasal spray Place 1 spray into both nostrils 2 (two) times daily. 01/19/18   Nikole Swartzentruber, Charline Bills, PA-C  furosemide (LASIX) 20 MG tablet Take 20 mg by mouth daily.    [provider]  gabapentin (NEURONTIN) 300 MG capsule Take 300 mg by mouth 3 (three) times daily.    [provider]  metFORMIN (GLUCOPHAGE) 500 MG tablet Take 500 mg by mouth 2 (two) times daily.    [provider]  montelukast (SINGULAIR) 10 MG tablet Take 10 mg by mouth at bedtime.    [provider]  omeprazole (PRILOSEC) 20 MG capsule Take 20 mg by mouth  daily.    [provider]  ondansetron (ZOFRAN ODT) 4 MG disintegrating tablet Allow 1-2 tablets to dissolve in your mouth every 8 hours as needed for nausea/vomiting 07/20/17   Hinda Kehr, MD  potassium chloride SA (KLOR-CON M20) 20 MEQ tablet Take 1 tablet (20 mEq total) by mouth daily. 07/20/17   Hinda Kehr, MD  predniSONE (DELTASONE) 50 MG tablet Take 1 tablet (50 mg total) by mouth daily with breakfast. 01/19/18   Darleth Eustache, Charline Bills, PA-C     Allergies Penicillins  No family history on file.  Social History Social History   Tobacco Use  . Smoking status: Never Smoker  . Smokeless tobacco: Never Used  Substance Use Topics  . Alcohol use: No  . Drug use: No     Review of Systems  Constitutional: No fever/chills Eyes: No visual changes. No discharge ENT: Positive for mild nasal congestion and sneezing Cardiovascular: no chest pain. Respiratory: Positive for worsening chronic cough.  Mild SOB which patient reports as baseline. Gastrointestinal: No abdominal pain.  No nausea, no vomiting.  No diarrhea.  No constipation. Musculoskeletal: Negative for musculoskeletal pain. Skin: Negative for rash, abrasions, lacerations, ecchymosis. Neurological: Negative for headaches, focal weakness or numbness. 10-point ROS otherwise negative.  ____________________________________________   PHYSICAL EXAM:  VITAL SIGNS: ED Triage Vitals  Enc Vitals Group     BP 01/19/18 1615 (!) 106/59     Pulse Rate 01/19/18 1615 70     Resp 01/19/18 1615 20     Temp 01/19/18 1615 98.6 F (37 C)     Temp Source 01/19/18 1615 Oral     SpO2 01/19/18 1615 95 %     Weight 01/19/18 1616 216 lb (98 kg)     Height 01/19/18 1616 5\' 4"  (1.626 m)     Head Circumference --      Peak Flow --      Pain Score 01/19/18 1616 0     Pain Loc --      Pain Edu? --      Excl. in [place]? --      Constitutional: Alert and oriented. Well appearing and in no acute distress. Eyes: Conjunctivae are normal. PERRL. EOMI. Head: Atraumatic. ENT:      Ears:       Nose: Mild clear congestion/rhinnorhea.  Turbinates are boggy      Mouth/Throat: Mucous membranes are moist.  Neck: No stridor.  Neck is supple full range of motion Hematological/Lymphatic/Immunilogical: No cervical lymphadenopathy. Cardiovascular: Normal rate, regular rhythm. Normal S1 and S2.  Good peripheral circulation. Respiratory: Normal respiratory effort without tachypnea or retractions.  Lungs with diffuse expiratory wheezing bilateral lower lung fields. Good air entry to the bases with no decreased or absent breath sounds. Musculoskeletal: Full range of motion to all extremities. No gross deformities appreciated. Neurologic:  Normal speech and language. No gross focal neurologic deficits are appreciated.  Skin:  Skin is warm, dry and intact. No rash noted. Psychiatric: Mood and affect are normal. Speech and behavior are normal. Patient exhibits appropriate insight and judgement.   ____________________________________________   LABS (all labs ordered are listed, but only abnormal results are displayed)  Labs Reviewed - No data to display ____________________________________________  EKG   ____________________________________________  RADIOLOGY Diamantina [place] Nikyla Navedo, personally viewed and evaluated these images (plain radiographs) as part of my medical decision making, as well as reviewing the written report by the radiologist.  I concur with radiologist finding of hilar fullness on the right side, stable  compared with previous imaging.  Mild left-sided basilar scarring, stable compared to previous imaging.  No effusion/consolidation consistent with pneumonia.  Dg Chest 2 View  Result Date: 01/19/2018 CLINICAL DATA:  Productive cough for 4 days. EXAM: CHEST - 2 VIEW COMPARISON:  09/03/2015 and prior radiographs FINDINGS: The cardiomediastinal silhouette is unchanged with stable mild RIGHT hilar fullness since 2009. Mild LEFT basilar scarring again noted. There is no evidence of focal airspace disease, pulmonary edema, suspicious pulmonary nodule/mass, pleural effusion, or pneumothorax. No acute bony abnormalities are identified. IMPRESSION: No active cardiopulmonary disease. Electronically Signed   By: Margarette Canada M.D.   On: 01/19/2018 16:51    ____________________________________________    PROCEDURES  Procedure(s) performed:    Procedures    Medications   albuterol (PROVENTIL) (2.5 MG/3ML) 0.083% nebulizer solution 2.5 mg (has no administration in time range)  methylPREDNISolone sodium succinate (SOLU-MEDROL) 125 mg/2 mL injection 125 mg (has no administration in time range)     ____________________________________________   INITIAL IMPRESSION / ASSESSMENT AND PLAN / ED COURSE  Pertinent labs & imaging results that were available during my care of the patient were reviewed by me and considered in my medical decision making (see chart for details).  Review of the [place] CSRS was performed in accordance of the [place] prior to dispensing any controlled drugs.     Patient's diagnosis is consistent with bronchitis.  Patient reports to the emergency department with chronic allergic rhinitis and chronic asthma symptoms.  Patient reports an increase in symptoms over the past several days.  Patient reports that she has a history of bronchitis and feels symptoms are similar.  Imaging reveals no acute consolidation concerning for pneumonia.  Stable changes in the right hilar and left basilar region are consistent with previous imaging.  Patient had diffuse expiratory wheezing bilaterally concerning for bronchitis.. Patient will be discharged home with prescriptions for Zyrtec, Flonase, 5-day course of steroid, albuterol inhaler. Patient is to follow up with primary care as needed or otherwise directed. Patient is given ED precautions to return to the ED for any worsening or new symptoms.     ____________________________________________  FINAL CLINICAL IMPRESSION(S) / ED DIAGNOSES  Final diagnoses:  Bronchitis      NEW MEDICATIONS STARTED DURING THIS VISIT:  ED Discharge Orders        Ordered    predniSONE (DELTASONE) 50 MG tablet  Daily with breakfast     01/19/18 1753    albuterol (PROVENTIL HFA;VENTOLIN HFA) 108 (90 Base) MCG/ACT inhaler  Every 4 hours PRN     01/19/18 1753    fluticasone (FLONASE) 50 MCG/ACT nasal spray  2 times daily      01/19/18 1753    cetirizine (ZYRTEC) 10 MG tablet  Daily     01/19/18 1753          This chart was dictated using voice recognition software/Dragon. Despite best efforts to proofread, errors can occur which can change the meaning. Any change was purely unintentional.    Darletta Moll, PA-C 01/19/18 1754    Schuyler Amor, MD 01/19/18 2251

## 2018-01-19 NOTE — ED Notes (Signed)
See triage note  Presents with cough for the past few days   States cough was prod  Afebrile on arrival

## 2018-01-19 NOTE — ED Notes (Signed)
AAOx3.  Skin warm and dry.  No SOB/ DOE observed.

## 2018-01-19 NOTE — ED Triage Notes (Signed)
Cough x 4 days, productive yellow.

## 2018-01-28 ENCOUNTER — Inpatient Hospital Stay
Admission: EM | Admit: 2018-01-28 | Discharge: 2018-02-06 | DRG: 870 | Disposition: A | Discharge status: Hospice | Payer: Medicare Other | Attending: Internal Medicine | Admitting: Internal Medicine

## 2018-01-28 ENCOUNTER — Emergency Department: Payer: Medicare Other

## 2018-01-28 ENCOUNTER — Other Ambulatory Visit: Payer: Self-pay

## 2018-01-28 ENCOUNTER — Inpatient Hospital Stay: Payer: Medicare Other

## 2018-01-28 DIAGNOSIS — Z515 Encounter for palliative care: Secondary | ICD-10-CM | POA: Diagnosis not present

## 2018-01-28 DIAGNOSIS — E876 Hypokalemia: Secondary | ICD-10-CM | POA: Diagnosis not present

## 2018-01-28 DIAGNOSIS — J9600 Acute respiratory failure, unspecified whether with hypoxia or hypercapnia: Secondary | ICD-10-CM | POA: Diagnosis not present

## 2018-01-28 DIAGNOSIS — R402431 Glasgow coma scale score 3-8, in the field [EMT or ambulance]: Secondary | ICD-10-CM | POA: Diagnosis not present

## 2018-01-28 DIAGNOSIS — E669 Obesity, unspecified: Secondary | ICD-10-CM | POA: Diagnosis present

## 2018-01-28 DIAGNOSIS — Z9049 Acquired absence of other specified parts of digestive tract: Secondary | ICD-10-CM | POA: Diagnosis not present

## 2018-01-28 DIAGNOSIS — E11641 Type 2 diabetes mellitus with hypoglycemia with coma: Secondary | ICD-10-CM | POA: Diagnosis present

## 2018-01-28 DIAGNOSIS — E870 Hyperosmolality and hypernatremia: Secondary | ICD-10-CM | POA: Diagnosis not present

## 2018-01-28 DIAGNOSIS — R4020 Unspecified coma: Secondary | ICD-10-CM | POA: Diagnosis not present

## 2018-01-28 DIAGNOSIS — Z794 Long term (current) use of insulin: Secondary | ICD-10-CM | POA: Diagnosis not present

## 2018-01-28 DIAGNOSIS — Z9911 Dependence on respirator [ventilator] status: Secondary | ICD-10-CM | POA: Diagnosis not present

## 2018-01-28 DIAGNOSIS — R401 Stupor: Secondary | ICD-10-CM | POA: Diagnosis not present

## 2018-01-28 DIAGNOSIS — Z789 Other specified health status: Secondary | ICD-10-CM

## 2018-01-28 DIAGNOSIS — N183 Chronic kidney disease, stage 3 (moderate): Secondary | ICD-10-CM | POA: Diagnosis present

## 2018-01-28 DIAGNOSIS — E872 Acidosis: Secondary | ICD-10-CM | POA: Diagnosis not present

## 2018-01-28 DIAGNOSIS — Z66 Do not resuscitate: Secondary | ICD-10-CM | POA: Diagnosis not present

## 2018-01-28 DIAGNOSIS — N181 Chronic kidney disease, stage 1: Secondary | ICD-10-CM | POA: Diagnosis present

## 2018-01-28 DIAGNOSIS — G9340 Encephalopathy, unspecified: Secondary | ICD-10-CM | POA: Diagnosis not present

## 2018-01-28 DIAGNOSIS — E119 Type 2 diabetes mellitus without complications: Secondary | ICD-10-CM

## 2018-01-28 DIAGNOSIS — R509 Fever, unspecified: Secondary | ICD-10-CM | POA: Diagnosis not present

## 2018-01-28 DIAGNOSIS — R6520 Severe sepsis without septic shock: Secondary | ICD-10-CM | POA: Diagnosis present

## 2018-01-28 DIAGNOSIS — N179 Acute kidney failure, unspecified: Secondary | ICD-10-CM | POA: Diagnosis not present

## 2018-01-28 DIAGNOSIS — E875 Hyperkalemia: Secondary | ICD-10-CM | POA: Diagnosis present

## 2018-01-28 DIAGNOSIS — Z6836 Body mass index (BMI) 36.0-36.9, adult: Secondary | ICD-10-CM

## 2018-01-28 DIAGNOSIS — F419 Anxiety disorder, unspecified: Secondary | ICD-10-CM | POA: Diagnosis present

## 2018-01-28 DIAGNOSIS — R4182 Altered mental status, unspecified: Secondary | ICD-10-CM

## 2018-01-28 DIAGNOSIS — J9690 Respiratory failure, unspecified, unspecified whether with hypoxia or hypercapnia: Secondary | ICD-10-CM | POA: Diagnosis not present

## 2018-01-28 DIAGNOSIS — R1310 Dysphagia, unspecified: Secondary | ICD-10-CM | POA: Diagnosis not present

## 2018-01-28 DIAGNOSIS — D649 Anemia, unspecified: Secondary | ICD-10-CM | POA: Diagnosis not present

## 2018-01-28 DIAGNOSIS — J9601 Acute respiratory failure with hypoxia: Secondary | ICD-10-CM | POA: Diagnosis present

## 2018-01-28 DIAGNOSIS — Z880 Allergy status to penicillin: Secondary | ICD-10-CM | POA: Diagnosis not present

## 2018-01-28 DIAGNOSIS — A419 Sepsis, unspecified organism: Principal | ICD-10-CM | POA: Diagnosis present

## 2018-01-28 DIAGNOSIS — R627 Adult failure to thrive: Secondary | ICD-10-CM | POA: Diagnosis present

## 2018-01-28 DIAGNOSIS — Z452 Encounter for adjustment and management of vascular access device: Secondary | ICD-10-CM

## 2018-01-28 DIAGNOSIS — Z7189 Other specified counseling: Secondary | ICD-10-CM | POA: Diagnosis not present

## 2018-01-28 DIAGNOSIS — Z4659 Encounter for fitting and adjustment of other gastrointestinal appliance and device: Secondary | ICD-10-CM

## 2018-01-28 DIAGNOSIS — I129 Hypertensive chronic kidney disease with stage 1 through stage 4 chronic kidney disease, or unspecified chronic kidney disease: Secondary | ICD-10-CM | POA: Diagnosis present

## 2018-01-28 DIAGNOSIS — E1165 Type 2 diabetes mellitus with hyperglycemia: Secondary | ICD-10-CM | POA: Diagnosis not present

## 2018-01-28 DIAGNOSIS — Z79899 Other long term (current) drug therapy: Secondary | ICD-10-CM

## 2018-01-28 DIAGNOSIS — G9341 Metabolic encephalopathy: Secondary | ICD-10-CM | POA: Diagnosis present

## 2018-01-28 DIAGNOSIS — Z4682 Encounter for fitting and adjustment of non-vascular catheter: Secondary | ICD-10-CM | POA: Diagnosis not present

## 2018-01-28 DIAGNOSIS — I10 Essential (primary) hypertension: Secondary | ICD-10-CM | POA: Diagnosis not present

## 2018-01-28 DIAGNOSIS — E162 Hypoglycemia, unspecified: Secondary | ICD-10-CM | POA: Diagnosis not present

## 2018-01-28 DIAGNOSIS — J45909 Unspecified asthma, uncomplicated: Secondary | ICD-10-CM | POA: Diagnosis present

## 2018-01-28 DIAGNOSIS — E118 Type 2 diabetes mellitus with unspecified complications: Secondary | ICD-10-CM | POA: Diagnosis not present

## 2018-01-28 DIAGNOSIS — E1122 Type 2 diabetes mellitus with diabetic chronic kidney disease: Secondary | ICD-10-CM | POA: Diagnosis not present

## 2018-01-28 LAB — COMPREHENSIVE METABOLIC PANEL WITH GFR
ALT: 96 U/L — ABNORMAL HIGH (ref 14–54)
AST: 129 U/L — ABNORMAL HIGH (ref 15–41)
Albumin: 3.5 g/dL (ref 3.5–5.0)
Alkaline Phosphatase: 97 U/L (ref 38–126)
Anion gap: 14 (ref 5–15)
BUN: 36 mg/dL — ABNORMAL HIGH (ref 6–20)
CO2: 30 mmol/L (ref 22–32)
Calcium: 8.5 mg/dL — ABNORMAL LOW (ref 8.9–10.3)
Chloride: 94 mmol/L — ABNORMAL LOW (ref 101–111)
Creatinine, Ser: 1.35 mg/dL — ABNORMAL HIGH (ref 0.44–1.00)
GFR calc Af Amer: 43 mL/min — ABNORMAL LOW
GFR calc non Af Amer: 37 mL/min — ABNORMAL LOW
Glucose, Bld: 177 mg/dL — ABNORMAL HIGH (ref 65–99)
Potassium: 5.3 mmol/L — ABNORMAL HIGH (ref 3.5–5.1)
Sodium: 138 mmol/L (ref 135–145)
Total Bilirubin: 0.9 mg/dL (ref 0.3–1.2)
Total Protein: 7.4 g/dL (ref 6.5–8.1)

## 2018-01-28 LAB — CBC WITH DIFFERENTIAL/PLATELET
Basophils Absolute: 0 K/uL (ref 0–0.1)
Basophils Relative: 0 %
Eosinophils Absolute: 0 K/uL (ref 0–0.7)
Eosinophils Relative: 0 %
HCT: 45.9 % (ref 35.0–47.0)
Hemoglobin: 14.9 g/dL (ref 12.0–16.0)
Lymphocytes Relative: 4 %
Lymphs Abs: 0.6 K/uL — ABNORMAL LOW (ref 1.0–3.6)
MCH: 28.7 pg (ref 26.0–34.0)
MCHC: 32.4 g/dL (ref 32.0–36.0)
MCV: 88.3 fL (ref 80.0–100.0)
Monocytes Absolute: 0.6 K/uL (ref 0.2–0.9)
Monocytes Relative: 4 %
Neutro Abs: 13.6 K/uL — ABNORMAL HIGH (ref 1.4–6.5)
Neutrophils Relative %: 92 %
Platelets: 472 K/uL — ABNORMAL HIGH (ref 150–440)
RBC: 5.2 MIL/uL (ref 3.80–5.20)
RDW: 15.4 % — ABNORMAL HIGH (ref 11.5–14.5)
WBC: 14.9 K/uL — ABNORMAL HIGH (ref 3.6–11.0)

## 2018-01-28 LAB — BLOOD GAS, ARTERIAL
Acid-Base Excess: 6.1 mmol/L — ABNORMAL HIGH (ref 0.0–2.0)
Bicarbonate: 29.6 mmol/L — ABNORMAL HIGH (ref 20.0–28.0)
FIO2: 0.4
MECHANICAL RATE: 14
O2 Saturation: 99.4 %
PCO2 ART: 38 mmHg (ref 32.0–48.0)
PEEP: 5 cmH2O
Patient temperature: 37
VT: 500 mL
pH, Arterial: 7.5 — ABNORMAL HIGH (ref 7.350–7.450)
pO2, Arterial: 148 mmHg — ABNORMAL HIGH (ref 83.0–108.0)

## 2018-01-28 LAB — GLUCOSE, CAPILLARY
GLUCOSE-CAPILLARY: 192 mg/dL — AB (ref 65–99)
GLUCOSE-CAPILLARY: 65 mg/dL (ref 65–99)
Glucose-Capillary: 49 mg/dL — ABNORMAL LOW (ref 65–99)

## 2018-01-28 LAB — URINALYSIS, COMPLETE (UACMP) WITH MICROSCOPIC
Bilirubin Urine: NEGATIVE
GLUCOSE, UA: NEGATIVE mg/dL
KETONES UR: NEGATIVE mg/dL
LEUKOCYTES UA: NEGATIVE
Nitrite: NEGATIVE
PROTEIN: NEGATIVE mg/dL
Specific Gravity, Urine: 1.006 (ref 1.005–1.030)
Squamous Epithelial / LPF: NONE SEEN (ref 0–5)
pH: 7 (ref 5.0–8.0)

## 2018-01-28 LAB — CBC
HEMATOCRIT: 46.2 % (ref 35.0–47.0)
HEMOGLOBIN: 14.6 g/dL (ref 12.0–16.0)
MCH: 27.8 pg (ref 26.0–34.0)
MCHC: 31.6 g/dL — AB (ref 32.0–36.0)
MCV: 87.9 fL (ref 80.0–100.0)
Platelets: 410 10*3/uL (ref 150–440)
RBC: 5.26 MIL/uL — ABNORMAL HIGH (ref 3.80–5.20)
RDW: 15.4 % — AB (ref 11.5–14.5)
WBC: 23.7 10*3/uL — ABNORMAL HIGH (ref 3.6–11.0)

## 2018-01-28 LAB — URINE DRUG SCREEN, QUALITATIVE (ARMC ONLY)
Amphetamines, Ur Screen: NOT DETECTED
BARBITURATES, UR SCREEN: NOT DETECTED
BENZODIAZEPINE, UR SCRN: POSITIVE — AB
Cannabinoid 50 Ng, Ur ~~LOC~~: NOT DETECTED
Cocaine Metabolite,Ur ~~LOC~~: NOT DETECTED
MDMA (Ecstasy)Ur Screen: NOT DETECTED
Methadone Scn, Ur: NOT DETECTED
Opiate, Ur Screen: NOT DETECTED
PHENCYCLIDINE (PCP) UR S: NOT DETECTED
TRICYCLIC, UR SCREEN: NOT DETECTED

## 2018-01-28 LAB — PROTIME-INR
INR: 0.98
Prothrombin Time: 12.9 seconds (ref 11.4–15.2)

## 2018-01-28 LAB — ETHANOL: Alcohol, Ethyl (B): <10 mg/dL

## 2018-01-28 LAB — LACTIC ACID, PLASMA
Lactic Acid, Venous: 2.5 mmol/L (ref 0.5–1.9)
Lactic Acid, Venous: 2.5 mmol/L (ref 0.5–1.9)

## 2018-01-28 LAB — TROPONIN I: Troponin I: <0.03 ng/mL (ref <0.03)

## 2018-01-28 MED ORDER — SODIUM CHLORIDE 0.9 % IV SOLN
2.0000 g | Freq: Once | INTRAVENOUS | Status: AC
  Administered 2018-01-28: 2 g via INTRAVENOUS
  Filled 2018-01-28: qty 2

## 2018-01-28 MED ORDER — FAMOTIDINE IN NACL 20-0.9 MG/50ML-% IV SOLN
20.0000 mg | Freq: Two times a day (BID) | INTRAVENOUS | Status: DC
  Administered 2018-01-28: 20 mg via INTRAVENOUS
  Filled 2018-01-28: qty 50

## 2018-01-28 MED ORDER — SODIUM CHLORIDE 0.9 % IV SOLN: 2.0000 g | INTRAVENOUS | Status: DC

## 2018-01-28 MED ORDER — SODIUM CHLORIDE 0.9 % IV SOLN
1.0000 g | Freq: Once | INTRAVENOUS | Status: AC
  Administered 2018-01-28: 1 g via INTRAVENOUS
  Filled 2018-01-28: qty 10

## 2018-01-28 MED ORDER — ENOXAPARIN SODIUM 40 MG/0.4ML ~~LOC~~ SOLN
40.0000 mg | SUBCUTANEOUS | Status: DC
  Administered 2018-01-28: 40 mg via SUBCUTANEOUS
  Filled 2018-01-28: qty 0.4

## 2018-01-28 MED ORDER — VITAL HIGH PROTEIN PO LIQD
1000.0000 mL | ORAL | Status: DC
  Administered 2018-01-28: 1000 mL

## 2018-01-28 MED ORDER — ACETAMINOPHEN 325 MG PO TABS: 650.0000 mg | ORAL_TABLET | ORAL | Status: DC | PRN

## 2018-01-28 MED ORDER — ETOMIDATE 2 MG/ML IV SOLN
30.0000 mg | Freq: Once | INTRAVENOUS | Status: AC
  Administered 2018-01-28: 30 mg via INTRAVENOUS

## 2018-01-28 MED ORDER — DEXTROSE 50 % IV SOLN
1.0000 | Freq: Once | INTRAVENOUS | Status: AC
  Administered 2018-01-28: 50 mL via INTRAVENOUS
  Filled 2018-01-28: qty 50

## 2018-01-28 MED ORDER — ASPIRIN 81 MG PO CHEW
324.0000 mg | CHEWABLE_TABLET | ORAL | Status: AC
  Administered 2018-01-28: 324 mg via ORAL
  Filled 2018-01-28: qty 4

## 2018-01-28 MED ORDER — PROPOFOL 1000 MG/100ML IV EMUL
INTRAVENOUS | Status: AC
  Administered 2018-01-28: 19:00:00
  Filled 2018-01-28: qty 100

## 2018-01-28 MED ORDER — FENTANYL CITRATE (PF) 100 MCG/2ML IJ SOLN
50.0000 ug | INTRAMUSCULAR | Status: DC | PRN
  Administered 2018-01-29 – 2018-02-01 (×4): 50 ug via INTRAVENOUS
  Filled 2018-01-28 (×5): qty 2

## 2018-01-28 MED ORDER — ONDANSETRON HCL 4 MG/2ML IJ SOLN: 4.0000 mg | Freq: Four times a day (QID) | INTRAMUSCULAR | Status: DC | PRN

## 2018-01-28 MED ORDER — INSULIN ASPART 100 UNIT/ML ~~LOC~~ SOLN: 0.0000 [IU] | SUBCUTANEOUS | Status: DC

## 2018-01-28 MED ORDER — INSULIN ASPART 100 UNIT/ML ~~LOC~~ SOLN
10.0000 [IU] | Freq: Once | SUBCUTANEOUS | Status: AC
  Administered 2018-01-28: 10 [IU] via INTRAVENOUS
  Filled 2018-01-28: qty 1

## 2018-01-28 MED ORDER — VANCOMYCIN HCL IN DEXTROSE 1-5 GM/200ML-% IV SOLN
1000.0000 mg | Freq: Once | INTRAVENOUS | Status: AC
  Administered 2018-01-28: 1000 mg via INTRAVENOUS
  Filled 2018-01-28: qty 200

## 2018-01-28 MED ORDER — VANCOMYCIN HCL 10 G IV SOLR
1250.0000 mg | INTRAVENOUS | Status: DC
  Administered 2018-01-29 – 2018-01-30 (×2): 1250 mg via INTRAVENOUS
  Filled 2018-01-28 (×3): qty 1250

## 2018-01-28 MED ORDER — SODIUM CHLORIDE 0.9 % IV SOLN
2.0000 g | Freq: Two times a day (BID) | INTRAVENOUS | Status: DC
  Administered 2018-01-29: 2 g via INTRAVENOUS
  Filled 2018-01-28 (×2): qty 2

## 2018-01-28 MED ORDER — PROPOFOL 1000 MG/100ML IV EMUL
5.0000 ug/kg/min | Freq: Once | INTRAVENOUS | Status: AC
  Administered 2018-01-28: 5 ug/kg/min via INTRAVENOUS

## 2018-01-28 MED ORDER — SUCCINYLCHOLINE CHLORIDE 20 MG/ML IJ SOLN
100.0000 mg | Freq: Once | INTRAMUSCULAR | Status: AC
  Administered 2018-01-28: 100 mg via INTRAVENOUS

## 2018-01-28 MED ORDER — SODIUM CHLORIDE 0.9 % IV SOLN
250.0000 mL | INTRAVENOUS | Status: DC | PRN
  Administered 2018-01-28 – 2018-02-02 (×5): 250 mL via INTRAVENOUS

## 2018-01-28 MED ORDER — PRO-STAT SUGAR FREE PO LIQD
30.0000 mL | Freq: Two times a day (BID) | ORAL | Status: DC
  Administered 2018-01-28 – 2018-01-29 (×2): 30 mL

## 2018-01-28 MED ORDER — DEXTROSE 5 % IV SOLN: INTRAVENOUS | Status: DC

## 2018-01-28 MED ORDER — FENTANYL CITRATE (PF) 100 MCG/2ML IJ SOLN: 50.0000 ug | INTRAMUSCULAR | Status: DC | PRN

## 2018-01-28 MED ORDER — PROPOFOL 1000 MG/100ML IV EMUL
0.0000 ug/kg/min | INTRAVENOUS | Status: DC
  Administered 2018-01-29: 10 ug/kg/min via INTRAVENOUS
  Administered 2018-01-29: 25 ug/kg/min via INTRAVENOUS
  Administered 2018-01-29: 5 ug/kg/min via INTRAVENOUS
  Administered 2018-01-30: 15 ug/kg/min via INTRAVENOUS
  Administered 2018-01-30: 25 ug/kg/min via INTRAVENOUS
  Administered 2018-01-30: 20 ug/kg/min via INTRAVENOUS
  Administered 2018-01-31: 25 ug/kg/min via INTRAVENOUS
  Filled 2018-01-28 (×6): qty 100

## 2018-01-28 MED ORDER — ACETAMINOPHEN 650 MG RE SUPP
650.0000 mg | Freq: Once | RECTAL | Status: AC
  Administered 2018-01-28: 650 mg via RECTAL
  Filled 2018-01-28: qty 1

## 2018-01-28 MED ORDER — ASPIRIN 300 MG RE SUPP: 300.0000 mg | RECTAL | Status: AC

## 2018-01-28 NOTE — ED Notes (Signed)
Respiratory at bedside with MD ready to intubate

## 2018-01-28 NOTE — ED Notes (Signed)
Patient transported to CT 

## 2018-01-28 NOTE — ED Notes (Signed)
30 etomidate given by Keane Police RN

## 2018-01-28 NOTE — Progress Notes (Signed)
CODE SEPSIS - PHARMACY COMMUNICATION  **Broad Spectrum Antibiotics should be administered within 1 hour of Sepsis diagnosis**  Time Code Sepsis Called/Page Received: 20:08  Antibiotics Ordered: Cefepime  Time of 1st antibiotic administration: 20:36  Additional action taken by pharmacy:   If necessary, Name of Provider/Nurse Contacted:     Laural Benes ,PharmD Clinical Pharmacist  01/28/2018  8:37 PM

## 2018-01-28 NOTE — Progress Notes (Signed)
Transported pt to Ct and back to ED 4 on vent without incident.

## 2018-01-28 NOTE — ED Triage Notes (Signed)
Pt found unresponsive at home. Per Family last known well was 9pm last night. EMS checked BS-31 upon arrival. Pt given gulcagon and D50. BS rechecked and was 330, last BS-162. Pt not responsive to MD at this time. VSS per EMS.

## 2018-01-28 NOTE — ED Notes (Signed)
MD Jannifer Franklin at bedside updating family on plan of care

## 2018-01-28 NOTE — ED Notes (Signed)
Pt difficult stick. Multiple RN's attempting IV and blood draws at this time.

## 2018-01-28 NOTE — Progress Notes (Signed)
Transported pt to ICU on vent without incident. ICU RT aware.

## 2018-01-28 NOTE — Progress Notes (Signed)
Pharmacy Antibiotic Note  Sabrina Quinn is a 77 y.o. female admitted on 01/28/2018 with sepsis.  Pharmacy has been consulted for cefepime and vancomycin dosing.  Plan: 1. Cefepime 2 gm IV Q12H  2. Vancomycin 1 gm IV x 1 in ED followed in approximately 8 hours (stacked dosing) by vancomycin 1.25 gm IV Q24H, predicted trough 17 mcg/ml, Pharmacy will continue to follow and adjust as needed to maintain trough 15 to 20 mcg/ml.   Vd 50.2 L, Ke 0.038 hr-1, T1/2 18.1 hr  Height: 5\' 4"  (162.6 cm) Weight: 216 lb (98 kg) IBW/kg (Calculated) : 54.7  Temp (24hrs), Avg:102.7 F (39.3 C), Min:102.1 F (38.9 C), Max:103.1 F (39.5 C)  Recent Labs  Lab 01/28/18 1855 01/28/18 1904  WBC 14.9*  --   CREATININE 1.35*  --   LATICACIDVEN  --  2.5*    Estimated Creatinine Clearance: 40.9 mL/min (A) (by C-G formula based on SCr of 1.35 mg/dL (H)).    Allergies  Allergen Reactions  . Penicillins Rash    Antimicrobials this admission:   Dose adjustments this admission:   Microbiology results:  BCx:   UCx:    Sputum:    MRSA PCR:   Thank you for allowing pharmacy to be a part of this patient's care.  Laural Benes, Pharm.D., BCPS Clinical Pharmacist 01/28/2018 9:37 PM

## 2018-01-28 NOTE — ED Notes (Signed)
MD at bedside with patients daughters.

## 2018-01-28 NOTE — ED Notes (Signed)
100 succ given by RN Keane Police

## 2018-01-28 NOTE — ED Notes (Signed)
Pt intubated at this time by MD Sidecki 23 at lip

## 2018-01-28 NOTE — Consult Note (Signed)
Name: Sabrina Quinn MRN: [id number] DOB: 03/10/1941    ADMISSION DATE:  01/28/2018 CONSULTATION DATE: 01/28/2018  REFERRING MD : Dr. Jannifer Franklin   CHIEF COMPLAINT: Unresponsiveness   BRIEF PATIENT DESCRIPTION:  77 yo female admitted with sepsis possible urinary source, hypoglycemia, and acute encephalopathy mechanically intubated for airway protection  SIGNIFICANT EVENTS/STUDIES:  06/12 Pt admitted to ICU mechanically intubated  06/12 CT Head/Cervical Spine: No acute intracranial abnormalities. No signs of acute traumatic injury to the cervical spine. Mild cerebral atrophy.  HISTORY OF PRESENT ILLNESS:   This is a 77 yo female with a PMH of HTN, Cdiff, Diabetes Mellitus, Asthma, and Anxiety.  She presented to [facility] ER via EMS on 06/12 after being found unresponsive by her family at home. Per ER notes her last known well time was 2100 on 06/11.  Upon EMS arrival at pts home her cbg was 64, therefore she received glucagon and D50 with cbg increasing to the 300's.  In the ER she remained unresponsive requiring mechanical intubation for airway protection.  CT Head/Cervical Spine negative.  She was recently seen in the ER on 06/3, and diagnosed with bronchitis at that time she did not require hospital admission. During current ER visit pt febrile and wbc 14.8, due to concern of sepsis she received iv abx.  She was subsequently admitted to ICU by hospitalist team for further workup and treatment.  PAST MEDICAL HISTORY :   has a past medical history of Anxiety, Asthma, Diabetes mellitus without complication ([place]), H/O Clostridium difficile infection, and Hypertension.  has a past surgical history that includes Cholecystectomy and Hernia repair. Prior to Admission medications   Medication Sig Start Date End Date Taking? Authorizing Provider  albuterol (PROVENTIL HFA;VENTOLIN HFA) 108 (90 Base) MCG/ACT inhaler Inhale 2 puffs into the lungs every 4 (four) hours as needed for wheezing or shortness of  breath. 01/19/18  Yes Cuthriell, Charline Bills, PA-C  ALPRAZolam Duanne Moron) 1 MG tablet Take 1 mg by mouth 2 (two) times daily. 01/13/18  Yes [provider]  amiloride-hydrochlorothiazide (MODURETIC) 5-50 MG tablet Take 1 tablet by mouth daily.   Yes [provider]  atorvastatin (LIPITOR) 20 MG tablet Take 20 mg by mouth daily.   Yes [provider]  carbidopa-levodopa (SINEMET IR) 25-100 MG tablet Take 1 tablet by mouth 4 (four) times daily.    Yes [provider]  cetirizine (ZYRTEC) 10 MG tablet Take 1 tablet (10 mg total) by mouth daily. 01/19/18  Yes Cuthriell, Charline Bills, PA-C  citalopram (CELEXA) 20 MG tablet Take 20 mg by mouth daily.   Yes [provider]  DILT-XR 120 MG 24 hr capsule Take 120 mg by mouth daily. 12/14/17  Yes [provider]  fluticasone (FLONASE) 50 MCG/ACT nasal spray Place 1 spray into both nostrils 2 (two) times daily. 01/19/18  Yes Cuthriell, Charline Bills, PA-C  furosemide (LASIX) 20 MG tablet Take 20 mg by mouth daily.   Yes [provider]  gabapentin (NEURONTIN) 300 MG capsule Take 300 mg by mouth 3 (three) times daily.   Yes [provider]  LANTUS SOLOSTAR 100 UNIT/ML Solostar Pen Inject 30 Units into the skin. 01/02/18  Yes [provider]  metFORMIN (GLUCOPHAGE) 500 MG tablet Take 500 mg by mouth 2 (two) times daily.   Yes [provider]  montelukast (SINGULAIR) 10 MG tablet Take 10 mg by mouth at bedtime.   Yes [provider]  omeprazole (PRILOSEC) 20 MG capsule Take 20 mg by mouth  daily.   Yes [provider]  potassium chloride SA (KLOR-CON M20) 20 MEQ tablet Take 1 tablet (20 mEq total) by mouth daily. 07/20/17  Yes Hinda Kehr, MD  ondansetron (ZOFRAN ODT) 4 MG disintegrating tablet Allow 1-2 tablets to dissolve in your mouth every 8 hours as needed for nausea/vomiting Patient not taking: Reported on 01/28/2018 07/20/17   Hinda Kehr, MD  predniSONE (DELTASONE)  50 MG tablet Take 1 tablet (50 mg total) by mouth daily with breakfast. Patient not taking: Reported on 01/28/2018 01/19/18   Cuthriell, Charline Bills, PA-C   Allergies  Allergen Reactions  . Penicillins Rash    FAMILY HISTORY:  family history is not on file. SOCIAL HISTORY:  reports that she has never smoked. She has never used smokeless tobacco. She reports that she does not drink alcohol or use drugs.  REVIEW OF SYSTEMS:  Unable to assess pt intubated   SUBJECTIVE:  Unable to assess pt intubated   VITAL SIGNS: Temp:  [102.1 F (38.9 C)-102.6 F (39.2 C)] 102.6 F (39.2 C) (06/12 1945) Pulse Rate:  [86-98] 86 (06/12 1945) Resp:  [18-24] 22 (06/12 1945) BP: (133-154)/(53-97) 150/53 (06/12 1945) SpO2:  [98 %-100 %] 100 % (06/12 1945) FiO2 (%):  [40 %] 40 % (06/12 1850) Weight:  [98 kg (216 lb)] 98 kg (216 lb) (06/12 1849)  PHYSICAL EXAMINATION: General: acutely ill appearing female, mechanically intubated NAD Neuro: not following commands or withdrawing from pain, PERRL  HEENT: supple, no JVD  Cardiovascular: nsr, rrr, no R/G  Lungs: faint rhonchi and diminished throughout, even, non labored  Abdomen: +BS x4, obese, soft, non tender, non distended  Musculoskeletal: normal tone, trace bilateral lower extremity edema  Skin: intact no rashes or lesions   Recent Labs  Lab 01/28/18 1855  NA 138  K 5.3*  CL 94*  CO2 30  BUN 36*  CREATININE 1.35*  GLUCOSE 177*   No results for input(s): HGB, HCT, WBC, PLT in the last 168 hours. Ct Head Wo Contrast  Result Date: 01/28/2018 CLINICAL DATA:  77 year old female found unresponsive at home. EXAM: CT HEAD WITHOUT CONTRAST CT CERVICAL SPINE WITHOUT CONTRAST TECHNIQUE: Multidetector CT imaging of the head and cervical spine was performed following the standard protocol without intravenous contrast. Multiplanar CT image reconstructions of the cervical spine were also generated. COMPARISON:  None. FINDINGS: CT HEAD FINDINGS Brain:  Patchy and confluent areas of decreased attenuation are noted throughout the deep and periventricular white matter of the cerebral hemispheres bilaterally, compatible with chronic microvascular ischemic disease. Physiologic calcifications are noted in the basal ganglia bilaterally. No evidence of acute infarction, hemorrhage, hydrocephalus, extra-axial collection or mass lesion/mass effect. Vascular: No hyperdense vessel or unexpected calcification. Skull: Normal. Negative for fracture or focal lesion. Sinuses/Orbits: No acute finding. Other: Intubated patient. CT CERVICAL SPINE FINDINGS Alignment: Normal. Skull base and vertebrae: No acute fracture. No primary bone lesion or focal pathologic process. Soft tissues and spinal canal: No prevertebral fluid or swelling. No visible canal hematoma. 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 multilevel degenerative disc disease, most severe at C7-T1 and T1-T2. Mild multilevel facet arthropathy. Upper chest: Intubated patient. Other: None. IMPRESSION: 1. No acute intracranial abnormalities. 2. No signs of acute traumatic injury to the cervical spine. 3. Mild cerebral atrophy. Electronically Signed   By: Vinnie Langton M.D.   On: 01/28/2018 19:48   Ct Cervical Spine Wo Contrast  Result Date: 01/28/2018 CLINICAL DATA:  77 year old female found unresponsive at home. EXAM:  CT HEAD WITHOUT CONTRAST CT CERVICAL SPINE WITHOUT CONTRAST TECHNIQUE: Multidetector CT imaging of the head and cervical spine was performed following the standard protocol without intravenous contrast. Multiplanar CT image reconstructions of the cervical spine were also generated. COMPARISON:  None. FINDINGS: CT HEAD FINDINGS Brain: Patchy and confluent areas of decreased attenuation are noted throughout the deep and periventricular white matter of the cerebral hemispheres bilaterally, compatible with chronic microvascular ischemic disease. Physiologic calcifications are noted in the  basal ganglia bilaterally. No evidence of acute infarction, hemorrhage, hydrocephalus, extra-axial collection or mass lesion/mass effect. Vascular: No hyperdense vessel or unexpected calcification. Skull: Normal. Negative for fracture or focal lesion. Sinuses/Orbits: No acute finding. Other: Intubated patient. CT CERVICAL SPINE FINDINGS Alignment: Normal. Skull base and vertebrae: No acute fracture. No primary bone lesion or focal pathologic process. Soft tissues and spinal canal: No prevertebral fluid or swelling. No visible canal hematoma. 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 multilevel degenerative disc disease, most severe at C7-T1 and T1-T2. Mild multilevel facet arthropathy. Upper chest: Intubated patient. Other: None. IMPRESSION: 1. No acute intracranial abnormalities. 2. No signs of acute traumatic injury to the cervical spine. 3. Mild cerebral atrophy. Electronically Signed   By: Vinnie Langton M.D.   On: 01/28/2018 19:48   Dg Chest Portable 1 View  Result Date: 01/28/2018 CLINICAL DATA:  Acute respiratory failure.  Intubation. EXAM: PORTABLE CHEST 1 VIEW COMPARISON:  01/19/2018 FINDINGS: A new endotracheal tube is seen with tip approximately 5 cm above the carina. Both lungs are clear. Heart size is within normal limits. No evidence of pneumothorax or pleural effusion. IMPRESSION: Endotracheal tube in appropriate position.  No active lung disease. Electronically Signed   By: Earle Gell M.D.   On: 01/28/2018 19:20    ASSESSMENT / PLAN: Acute encephalopathy possibly secondary to sepsis  Sepsis possibly urinary source  Acute renal failure with hyperkalemia  Lactic acidosis  Transaminitis  Hypoglycemia-resolved Hx: Asthma, HTN, Diabetes Mellitus, Cdiff, and Anxiety P: Full vent support for now-vent settings reviewed and established  SBT once all parameters met VAP bundle implemented  Continuous telemetry monitoring  Trend troponin's  Trend CMP Replace electrolytes  as indicated  Monitor UOP  Avoid nephrotoxic medications  VTE px: subq lovenox  Trend CBC  Monitor for s/sx of bleeding  Transfuse for hgb <7 Trend WBC and monitor fever curve Trend lactic acid  Continue abx  Follow cultures  SUP px: iv pepcid  Will start TF's Maintain RASS goal 0 to -1 Propofol gtt and prn fentanyl to maintain RASS goal  WUA daily  If mentation does not improve will need MRI of brain and neurology consult   Marda Stalker, [place] Pager [phone] (please enter 7 digits) PCCM Consult Pager [phone] (please enter 7 digits)

## 2018-01-28 NOTE — ED Provider Notes (Signed)
[facility] Emergency Department Provider Note ____________________________________________   First MD Initiated Contact with Patient 01/28/18 1901     (approximate)  I have reviewed the triage vital signs and the nursing notes.   HISTORY  Chief Complaint Altered Mental Status  Level 5 caveat: History of present illness limited due to altered mental status/unresponsiveness  HPI ANTARA BRECHEISEN is a 77 y.o. female with PMH as noted below who was found unresponsive by her family members this evening.  The patient was last seen at her baseline yesterday at 9 PM (around 22 hours ago).  Per EMS, her family member saw her today thought she was sleeping, but then she was found to be unresponsive in the evening.  Past Medical History:  Diagnosis Date  . Anxiety   . Asthma   . Diabetes mellitus without complication ([place])   . H/O Clostridium difficile infection   . Hypertension     Patient Active Problem List   Diagnosis Date Noted  . Acute respiratory failure with hypoxia ([place]) 01/28/2018  . Sepsis ([place]) 01/28/2018  . HTN (hypertension) 01/28/2018  . Asthma 01/28/2018  . Diabetes ([place]) 01/28/2018  . Hyperkalemia 01/28/2018  . Altered mental status     Past Surgical History:  Procedure Laterality Date  . CHOLECYSTECTOMY    . HERNIA REPAIR      Prior to Admission medications   Medication Sig Start Date End Date Taking? Authorizing Provider  albuterol (PROVENTIL HFA;VENTOLIN HFA) 108 (90 Base) MCG/ACT inhaler Inhale 2 puffs into the lungs every 4 (four) hours as needed for wheezing or shortness of breath. 01/19/18  Yes Cuthriell, Charline Bills, PA-C  ALPRAZolam Duanne Moron) 1 MG tablet Take 1 mg by mouth 2 (two) times daily. 01/13/18  Yes [provider]  amiloride-hydrochlorothiazide (MODURETIC) 5-50 MG tablet Take 1 tablet by mouth daily.   Yes [provider]  atorvastatin (LIPITOR) 20 MG tablet Take 20 mg by mouth daily.   Yes [provider]  carbidopa-levodopa (SINEMET IR) 25-100 MG tablet Take 1 tablet by mouth 4 (four) times daily.    Yes [provider]  cetirizine (ZYRTEC) 10 MG tablet Take 1 tablet (10 mg total) by mouth daily. 01/19/18  Yes Cuthriell, Charline Bills, PA-C  citalopram (CELEXA) 20 MG tablet Take 20 mg by mouth daily.   Yes [provider]  DILT-XR 120 MG 24 hr capsule Take 120 mg by mouth daily. 12/14/17  Yes [provider]  fluticasone (FLONASE) 50 MCG/ACT nasal spray Place 1 spray into both nostrils 2 (two) times daily. 01/19/18  Yes Cuthriell, Charline Bills, PA-C  furosemide (LASIX) 20 MG tablet Take 20 mg by mouth daily.   Yes [provider]  gabapentin (NEURONTIN) 300 MG capsule Take 300 mg by mouth 3 (three) times daily.   Yes [provider]  LANTUS SOLOSTAR 100 UNIT/ML Solostar Pen Inject 30 Units into the skin. 01/02/18  Yes [provider]  metFORMIN (GLUCOPHAGE) 500 MG tablet Take 500 mg by mouth 2 (two) times daily.   Yes [provider]  montelukast (SINGULAIR) 10 MG tablet Take 10 mg by mouth at bedtime.   Yes [provider]  omeprazole (PRILOSEC) 20 MG capsule Take 20 mg by mouth daily.   Yes [provider]  potassium chloride SA (KLOR-CON M20) 20 MEQ tablet Take 1 tablet (20 mEq total) by mouth daily. 07/20/17  Yes Hinda Kehr, MD  ondansetron (ZOFRAN ODT) 4 MG disintegrating tablet Allow 1-2 tablets to  dissolve in your mouth every 8 hours as needed for nausea/vomiting Patient not taking: Reported on 01/28/2018 07/20/17   Hinda Kehr, MD  predniSONE (DELTASONE) 50 MG tablet Take 1 tablet (50 mg total) by mouth daily with breakfast. Patient not taking: Reported on 01/28/2018 01/19/18   Cuthriell, Charline Bills, PA-C    Allergies Penicillins  No family history on file.  Social History Social History   Tobacco Use  . Smoking status: Never Smoker  . Smokeless tobacco: Never Used  Substance Use Topics  .  Alcohol use: No  . Drug use: No    Review of Systems Level 5 caveat: Unable to obtain review of systems due to altered mental status/unresponsiveness    ____________________________________________   PHYSICAL EXAM:  VITAL SIGNS: ED Triage Vitals  Enc Vitals Group     BP 01/28/18 1853 133/76     Pulse Rate 01/28/18 1848 98     Resp 01/28/18 1848 (!) 23     Temp --      Temp src --      SpO2 01/28/18 1848 98 %     Weight 01/28/18 1849 216 lb (98 kg)     Height 01/28/18 1849 5\' 4"  (1.626 m)     Head Circumference --      Peak Flow --      Pain Score --      Pain Loc --      Pain Edu? --      Excl. in [place]? --     Constitutional: Unresponsive to deep painful stimuli.  Breathing spontaneously. Eyes: Conjunctivae are normal.  PERRLA, sluggish. Head: Atraumatic. Nose: No congestion/rhinnorhea. Mouth/Throat: Mucous membranes are somewhat dry.   Neck: Normal range of motion.  Cardiovascular: Normal rate, regular rhythm. Grossly normal heart sounds.  Good peripheral circulation. Respiratory: Poor respiratory effort.  No retractions. Lungs CTAB. Gastrointestinal: Soft and nontender.  Genitourinary: No flank tenderness. Musculoskeletal: No lower extremity edema.  Extremities warm and well perfused.  Neurologic: Unresponsive. Skin:   No rash noted. Psychiatric: Unable to assess  ____________________________________________   LABS (all labs ordered are listed, but only abnormal results are displayed)  Labs Reviewed  COMPREHENSIVE METABOLIC PANEL - Abnormal; Notable for the following components:      Result Value   Potassium 5.3 (*)    Chloride 94 (*)    Glucose, Bld 177 (*)    BUN 36 (*)    Creatinine, Ser 1.35 (*)    Calcium 8.5 (*)    AST 129 (*)    ALT 96 (*)    GFR calc non Af Amer 37 (*)    GFR calc Af Amer 43 (*)    All other components within normal limits  LACTIC ACID, PLASMA - Abnormal; Notable for the following components:   Lactic Acid, Venous 2.5 (*)      All other components within normal limits  LACTIC ACID, PLASMA - Abnormal; Notable for the following components:   Lactic Acid, Venous 2.5 (*)    All other components within normal limits  CBC WITH DIFFERENTIAL/PLATELET - Abnormal; Notable for the following components:   WBC 14.9 (*)    RDW 15.4 (*)    Platelets 472 (*)    Neutro Abs 13.6 (*)    Lymphs Abs 0.6 (*)    All other components within normal limits  URINALYSIS, COMPLETE (UACMP) WITH MICROSCOPIC - Abnormal; Notable for the following components:   Color, Urine YELLOW (*)    APPearance HAZY (*)  Hgb urine dipstick LARGE (*)    Bacteria, UA MANY (*)    All other components within normal limits  URINE DRUG SCREEN, QUALITATIVE ([facility] ONLY) - Abnormal; Notable for the following components:   Benzodiazepine, Ur Scrn POSITIVE (*)    All other components within normal limits  GLUCOSE, CAPILLARY - Abnormal; Notable for the following components:   Glucose-Capillary 192 (*)    All other components within normal limits  BLOOD GAS, ARTERIAL - Abnormal; Notable for the following components:   pH, Arterial 7.50 (*)    pO2, Arterial 148 (*)    Bicarbonate 29.6 (*)    Acid-Base Excess 6.1 (*)    All other components within normal limits  URINE CULTURE  CULTURE, BLOOD (ROUTINE X 2)  CULTURE, BLOOD (ROUTINE X 2)  MRSA PCR SCREENING  ETHANOL  TROPONIN I  PROTIME-INR  GLUCOSE, CAPILLARY  PROCALCITONIN  PROCALCITONIN  CBC  CREATININE, SERUM  CBC  BASIC METABOLIC PANEL  MAGNESIUM  MAGNESIUM  PHOSPHORUS  PHOSPHORUS  BLOOD GAS, ARTERIAL   ____________________________________________  EKG  ED ECG REPORT I, Arta Silence, the attending physician, personally viewed and interpreted this ECG.  Date: 01/28/2018 EKG Time: 1915 Rate: 96 Rhythm: normal sinus rhythm QRS Axis: Right axis Intervals: Prolonged QT ST/T Wave abnormalities: Nonspecific lateral T wave abnormalities Narrative Interpretation: no evidence of acute  ischemia  ____________________________________________  RADIOLOGY  CT head: No ICH or other acute findings CXR: No focal infiltrate  ____________________________________________   PROCEDURES  Procedure(s) performed: Yes   Procedure Name: Intubation Date/Time: 01/28/2018 11:04 PM Performed by: Arta Silence, MD Pre-anesthesia Checklist: Patient identified and Suction available Oxygen Delivery Method: Nasal cannula Preoxygenation: Pre-oxygenation with 100% oxygen Induction Type: Rapid sequence Laryngoscope Size: Glidescope and 3 Tube size: 7.0 mm Number of attempts: 1 Airway Equipment and Method: Video-laryngoscopy Placement Confirmation: ETT inserted through vocal cords under direct vision,  CO2 detector and Breath sounds checked- equal and bilateral Secured at: 23 cm       Critical Care performed: Yes  CRITICAL CARE Performed by: Arta Silence   Total critical care time: 60 minutes  Critical care time was exclusive of separately billable procedures and treating other patients.  Critical care was necessary to treat or prevent imminent or life-threatening deterioration.  Critical care was time spent personally by me on the following activities: development of treatment plan with patient and/or surrogate as well as nursing, discussions with consultants, evaluation of patient's response to treatment, examination of patient, obtaining history from patient or surrogate, ordering and performing treatments and interventions, ordering and review of laboratory studies, ordering and review of radiographic studies, pulse oximetry and re-evaluation of patient's condition.  ____________________________________________   INITIAL IMPRESSION / ASSESSMENT AND PLAN / ED COURSE  Pertinent labs & imaging results that were available during my care of the patient were reviewed by me and considered in my medical decision making (see chart for details).  77 year old female  with PMH as noted above presents with altered mental status, found unresponsive this evening.  Per EMS, the patient was last seen at her baseline yesterday.  Her glucose was low on EMS arrival, but after glucagon and D50 it went to the 300s and the patient's mental status did not change.  On exam, the patient is obtunded.  She was breathing spontaneously but drooling, and not responsive to any stimuli including deep sternal rub.  The remainder of the exam is as described above.  I reviewed the past medical records in Epic; the patient  was most recently seen in the ED 9 days ago with shortness of breath and wheezing.  She was also treated and released in December of last year for diarrhea.  Due to the patient's unresponsiveness and concern for being unable to protect her airway, she required emergent intubation which was completed successfully on the first attempt.  Differential includes ICH, severe stroke or other CNS cause, infection/sepsis (although this would be less likely given the level of the patient's obtundation), or respiratory failure with hypoxia.  Plan: Broad lab work-up, CT head, chest x-ray, sepsis work-up, and reassess.  ----------------------------------------- 11:05 PM on 01/28/2018 -----------------------------------------  Work-up revealed elevated WBC, lactic acid, and findings consistent with possible UTI.  Patient also developed a fever.  I initiated the sepsis protocol, and gave broad-spectrum antibiotics.  CT head showed no ICH or other acute findings.  I had a thorough discussion of the results of the work-up and the plan of care with the patient's family members who arrived later.  The patient will require admission to the ICU.  I signed the patient out to the hospitalist Dr. Jannifer Franklin.  ____________________________________________   FINAL CLINICAL IMPRESSION(S) / ED DIAGNOSES  Final diagnoses:  Altered mental status, unspecified altered mental status type  Acute  respiratory failure, unspecified whether with hypoxia or hypercapnia ([place])  Sepsis, due to unspecified organism [facility])      NEW MEDICATIONS STARTED DURING THIS VISIT:  Current Discharge Medication List       Note:  This document was prepared using Dragon voice recognition software and may include unintentional dictation errors.    Arta Silence, MD 01/28/18 2306

## 2018-01-28 NOTE — H&P (Signed)
[place] [facility] [place] NAME: Sabrina Quinn    MR#:  [id number]  DATE OF BIRTH:  08-02-1941  DATE OF ADMISSION:  01/28/2018  PRIMARY CARE PHYSICIAN: Cletis Athens, MD   REQUESTING/REFERRING PHYSICIAN: Cherylann Banas, MD  CHIEF COMPLAINT:   Chief Complaint  Patient presents with  . Altered Mental Status    HISTORY OF PRESENT ILLNESS:  Sabrina Quinn  is a 77 y.o. female who presents via EMS from home where she was found to be unresponsive.  Patient's daughter states that she was last known to be well the night prior.  It is unclear at this time what happened, the patient does meet sepsis criteria upfront.  Source of infection is unclear at this time.  Patient required intubation due to hypoxia due to being so obtunded.  Hospitalist were called for admission  PAST MEDICAL HISTORY:   Past Medical History:  Diagnosis Date  . Anxiety   . Asthma   . Diabetes mellitus without complication ([place])   . H/O Clostridium difficile infection   . Hypertension      PAST SURGICAL HISTORY:   Past Surgical History:  Procedure Laterality Date  . CHOLECYSTECTOMY    . HERNIA REPAIR       SOCIAL HISTORY:   Social History   Tobacco Use  . Smoking status: Never Smoker  . Smokeless tobacco: Never Used  Substance Use Topics  . Alcohol use: No     FAMILY HISTORY:  Unable to review family history due to patient's condition - intubated and sedated.   DRUG ALLERGIES:   Allergies  Allergen Reactions  . Penicillins Rash    MEDICATIONS AT HOME:   Prior to Admission medications   Medication Sig Start Date End Date Taking? Authorizing Provider  albuterol (PROVENTIL HFA;VENTOLIN HFA) 108 (90 Base) MCG/ACT inhaler Inhale 2 puffs into the lungs every 4 (four) hours as needed for wheezing or shortness of breath. 01/19/18  Yes Cuthriell, Charline Bills, PA-C  ALPRAZolam Duanne Moron) 1 MG tablet Take 1 mg by mouth 2 (two) times daily. 01/13/18  Yes [provider]  amiloride-hydrochlorothiazide (MODURETIC) 5-50 MG tablet Take 1 tablet by mouth daily.   Yes [provider]  atorvastatin (LIPITOR) 20 MG tablet Take 20 mg by mouth daily.   Yes [provider]  carbidopa-levodopa (SINEMET IR) 25-100 MG tablet Take 1 tablet by mouth 4 (four) times daily.    Yes [provider]  cetirizine (ZYRTEC) 10 MG tablet Take 1 tablet (10 mg total) by mouth daily. 01/19/18  Yes Cuthriell, Charline Bills, PA-C  citalopram (CELEXA) 20 MG tablet Take 20 mg by mouth daily.   Yes [provider]  DILT-XR 120 MG 24 hr capsule Take 120 mg by mouth daily. 12/14/17  Yes [provider]  fluticasone (FLONASE) 50 MCG/ACT nasal spray Place 1 spray into both nostrils 2 (two) times daily. 01/19/18  Yes Cuthriell, Charline Bills, PA-C  furosemide (LASIX) 20 MG tablet Take 20 mg by mouth daily.   Yes [provider]  gabapentin (NEURONTIN) 300 MG capsule Take 300 mg by mouth 3 (three) times daily.   Yes [provider]  LANTUS SOLOSTAR 100 UNIT/ML Solostar Pen Inject 30 Units into the skin. 01/02/18  Yes [provider]  metFORMIN (GLUCOPHAGE) 500 MG tablet Take 500 mg by mouth 2 (two) times daily.   Yes [provider]  montelukast (SINGULAIR) 10 MG tablet Take 10 mg by mouth at bedtime.  Yes [provider]  omeprazole (PRILOSEC) 20 MG capsule Take 20 mg by mouth daily.   Yes [provider]  potassium chloride SA (KLOR-CON M20) 20 MEQ tablet Take 1 tablet (20 mEq total) by mouth daily. 07/20/17  Yes Hinda Kehr, MD  ondansetron (ZOFRAN ODT) 4 MG disintegrating tablet Allow 1-2 tablets to dissolve in your mouth every 8 hours as needed for nausea/vomiting Patient not taking: Reported on 01/28/2018 07/20/17   Hinda Kehr, MD  predniSONE (DELTASONE) 50 MG tablet Take 1 tablet (50 mg total) by mouth daily with breakfast. Patient not taking: Reported on 01/28/2018 01/19/18   Cuthriell, Charline Bills, PA-C    REVIEW OF SYSTEMS:  Review of Systems  Unable to perform ROS: Critical illness     VITAL SIGNS:   Vitals:   01/28/18 1930 01/28/18 1945 01/28/18 2015 01/28/18 2030  BP: 135/81 (!) 150/53 (!) 133/59 123/73  Pulse: 89 86 86 87  Resp: 18 (!) 22 (!) 22 20  Temp: (!) 102.1 F (38.9 C) (!) 102.6 F (39.2 C) (!) 103 F (39.4 C) (!) 103.1 F (39.5 C)  SpO2: 100% 100% 100% 100%  Weight:      Height:       Wt Readings from Last 3 Encounters:  01/28/18 98 kg (216 lb)  01/19/18 98 kg (216 lb)  07/19/17 96.6 kg (213 lb)    PHYSICAL EXAMINATION:  Physical Exam  Vitals reviewed. Constitutional: She appears well-developed and well-nourished. No distress.  HENT:  Head: Normocephalic and atraumatic.  Mouth/Throat: Oropharynx is clear and moist.  Eyes: Pupils are equal, round, and reactive to light. Conjunctivae and EOM are normal. No scleral icterus.  Neck: Normal range of motion. Neck supple. No JVD present. No thyromegaly present.  Cardiovascular: Normal rate, regular rhythm and intact distal pulses. Exam reveals no gallop and no friction rub.  No murmur heard. Respiratory: Breath sounds normal. She is in respiratory distress (Intubated). She has no wheezes. She has no rales.  GI: Soft. Bowel sounds are normal. She exhibits no distension. There is no tenderness.  Musculoskeletal: Normal range of motion. She exhibits no edema.  No arthritis, no gout  Lymphadenopathy:    She has no cervical adenopathy.  Neurological:  Unable to assess due to patient condition  Skin: Skin is warm and dry. No rash noted. No erythema.  Psychiatric:  Unable to assess due to patient condition    LABORATORY PANEL:   CBC Recent Labs  Lab 01/28/18 1855  WBC 14.9*  HGB 14.9  HCT 45.9  PLT 472*   ------------------------------------------------------------------------------------------------------------------  Chemistries  Recent Labs  Lab 01/28/18 1855  NA 138  K 5.3*  CL 94*   CO2 30  GLUCOSE 177*  BUN 36*  CREATININE 1.35*  CALCIUM 8.5*  AST 129*  ALT 96*  ALKPHOS 97  BILITOT 0.9   ------------------------------------------------------------------------------------------------------------------  Cardiac Enzymes Recent Labs  Lab 01/28/18 1855  TROPONINI <0.03   ------------------------------------------------------------------------------------------------------------------  RADIOLOGY:  Ct Head Wo Contrast  Result Date: 01/28/2018 CLINICAL DATA:  77 year old female found unresponsive at home. EXAM: CT HEAD WITHOUT CONTRAST CT CERVICAL SPINE WITHOUT CONTRAST TECHNIQUE: Multidetector CT imaging of the head and cervical spine was performed following the standard protocol without intravenous contrast. Multiplanar CT image reconstructions of the cervical spine were also generated. COMPARISON:  None. FINDINGS: CT HEAD FINDINGS Brain: Patchy and confluent areas of decreased attenuation are noted throughout the deep and periventricular white matter of the cerebral hemispheres bilaterally, compatible with chronic microvascular ischemic  disease. Physiologic calcifications are noted in the basal ganglia bilaterally. No evidence of acute infarction, hemorrhage, hydrocephalus, extra-axial collection or mass lesion/mass effect. Vascular: No hyperdense vessel or unexpected calcification. Skull: Normal. Negative for fracture or focal lesion. Sinuses/Orbits: No acute finding. Other: Intubated patient. CT CERVICAL SPINE FINDINGS Alignment: Normal. Skull base and vertebrae: No acute fracture. No primary bone lesion or focal pathologic process. Soft tissues and spinal canal: No prevertebral fluid or swelling. No visible canal hematoma. 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 multilevel degenerative disc disease, most severe at C7-T1 and T1-T2. Mild multilevel facet arthropathy. Upper chest: Intubated patient. Other: None. IMPRESSION: 1. No acute intracranial  abnormalities. 2. No signs of acute traumatic injury to the cervical spine. 3. Mild cerebral atrophy. Electronically Signed   By: Vinnie Langton M.D.   On: 01/28/2018 19:48   Ct Cervical Spine Wo Contrast  Result Date: 01/28/2018 CLINICAL DATA:  77 year old female found unresponsive at home. EXAM: CT HEAD WITHOUT CONTRAST CT CERVICAL SPINE WITHOUT CONTRAST TECHNIQUE: Multidetector CT imaging of the head and cervical spine was performed following the standard protocol without intravenous contrast. Multiplanar CT image reconstructions of the cervical spine were also generated. COMPARISON:  None. FINDINGS: CT HEAD FINDINGS Brain: Patchy and confluent areas of decreased attenuation are noted throughout the deep and periventricular white matter of the cerebral hemispheres bilaterally, compatible with chronic microvascular ischemic disease. Physiologic calcifications are noted in the basal ganglia bilaterally. No evidence of acute infarction, hemorrhage, hydrocephalus, extra-axial collection or mass lesion/mass effect. Vascular: No hyperdense vessel or unexpected calcification. Skull: Normal. Negative for fracture or focal lesion. Sinuses/Orbits: No acute finding. Other: Intubated patient. CT CERVICAL SPINE FINDINGS Alignment: Normal. Skull base and vertebrae: No acute fracture. No primary bone lesion or focal pathologic process. Soft tissues and spinal canal: No prevertebral fluid or swelling. No visible canal hematoma. 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 multilevel degenerative disc disease, most severe at C7-T1 and T1-T2. Mild multilevel facet arthropathy. Upper chest: Intubated patient. Other: None. IMPRESSION: 1. No acute intracranial abnormalities. 2. No signs of acute traumatic injury to the cervical spine. 3. Mild cerebral atrophy. Electronically Signed   By: Vinnie Langton M.D.   On: 01/28/2018 19:48   Dg Chest Portable 1 View  Result Date: 01/28/2018 CLINICAL DATA:  Acute  respiratory failure.  Intubation. EXAM: PORTABLE CHEST 1 VIEW COMPARISON:  01/19/2018 FINDINGS: A new endotracheal tube is seen with tip approximately 5 cm above the carina. Both lungs are clear. Heart size is within normal limits. No evidence of pneumothorax or pleural effusion. IMPRESSION: Endotracheal tube in appropriate position.  No active lung disease. Electronically Signed   By: Earle Gell M.D.   On: 01/28/2018 19:20    EKG:   Orders placed or performed during the hospital encounter of 01/28/18  . ED EKG  . ED EKG  . EKG 12-Lead  . EKG 12-Lead    IMPRESSION AND PLAN:  Principal Problem:   Sepsis ([place]) -IV antibiotics, lactic acid was mildly elevated and we will administer IV fluids until within normal limits, unclear source at this time, possibly urinary source, culture sent Active Problems:   Acute respiratory failure with hypoxia (HCC) -intubated and mechanically ventilated, admit to ICU   HTN (hypertension) -stable, continue home meds   Diabetes ([place]) -sliding scale insulin with corresponding glucose checks  Chart review performed and case discussed with ED provider. Labs, imaging and/or ECG reviewed by provider and discussed with patient/family. Management  plans discussed with the patient and/or family.  DVT PROPHYLAXIS: SubQ lovenox  GI PROPHYLAXIS: H2 blocker  ADMISSION STATUS: Inpatient  CODE STATUS: Full  TOTAL CRITICAL CARE TIME TAKING CARE OF THIS PATIENT: 50 minutes.   Cheston Coury [place] 01/28/2018, 9:28 PM  [facility]  Office  [phone]  CC: Primary care physician; Cletis Athens, MD  Note:  This document was prepared using Dragon voice recognition software and may include unintentional dictation errors.

## 2018-01-29 ENCOUNTER — Other Ambulatory Visit: Payer: Self-pay

## 2018-01-29 ENCOUNTER — Inpatient Hospital Stay: Payer: Medicare Other

## 2018-01-29 ENCOUNTER — Encounter: Payer: Self-pay | Admitting: *Deleted

## 2018-01-29 DIAGNOSIS — R402431 Glasgow coma scale score 3-8, in the field [EMT or ambulance]: Secondary | ICD-10-CM

## 2018-01-29 DIAGNOSIS — Z9911 Dependence on respirator [ventilator] status: Secondary | ICD-10-CM

## 2018-01-29 DIAGNOSIS — R6520 Severe sepsis without septic shock: Secondary | ICD-10-CM

## 2018-01-29 DIAGNOSIS — A419 Sepsis, unspecified organism: Principal | ICD-10-CM

## 2018-01-29 DIAGNOSIS — E118 Type 2 diabetes mellitus with unspecified complications: Secondary | ICD-10-CM

## 2018-01-29 DIAGNOSIS — Z794 Long term (current) use of insulin: Secondary | ICD-10-CM

## 2018-01-29 LAB — CREATININE, SERUM
Creatinine, Ser: 1.4 mg/dL — ABNORMAL HIGH (ref 0.44–1.00)
GFR calc Af Amer: 41 mL/min — ABNORMAL LOW (ref >60)
GFR calc non Af Amer: 36 mL/min — ABNORMAL LOW (ref >60)

## 2018-01-29 LAB — BLOOD GAS, ARTERIAL
Acid-Base Excess: 4.3 mmol/L — ABNORMAL HIGH (ref 0.0–2.0)
Bicarbonate: 27.3 mmol/L (ref 20.0–28.0)
FIO2: 0.3
MECHANICAL RATE: 14
MECHVT: 450 mL
O2 Saturation: 98.6 %
PATIENT TEMPERATURE: 37
PEEP/CPAP: 5 cmH2O
PH ART: 7.5 — AB (ref 7.350–7.450)
PO2 ART: 108 mmHg (ref 83.0–108.0)
pCO2 arterial: 35 mmHg (ref 32.0–48.0)

## 2018-01-29 LAB — GLUCOSE, CAPILLARY
GLUCOSE-CAPILLARY: 133 mg/dL — AB (ref 65–99)
GLUCOSE-CAPILLARY: 153 mg/dL — AB (ref 65–99)
GLUCOSE-CAPILLARY: 163 mg/dL — AB (ref 65–99)
GLUCOSE-CAPILLARY: 183 mg/dL — AB (ref 65–99)
GLUCOSE-CAPILLARY: 192 mg/dL — AB (ref 65–99)
GLUCOSE-CAPILLARY: 220 mg/dL — AB (ref 65–99)
GLUCOSE-CAPILLARY: 242 mg/dL — AB (ref 65–99)
Glucose-Capillary: 140 mg/dL — ABNORMAL HIGH (ref 65–99)
Glucose-Capillary: 149 mg/dL — ABNORMAL HIGH (ref 65–99)
Glucose-Capillary: 160 mg/dL — ABNORMAL HIGH (ref 65–99)
Glucose-Capillary: 89 mg/dL (ref 65–99)

## 2018-01-29 LAB — PHOSPHORUS
Phosphorus: 3.4 mg/dL (ref 2.5–4.6)
Phosphorus: 3.9 mg/dL (ref 2.5–4.6)

## 2018-01-29 LAB — CBC
HCT: 41.8 % (ref 35.0–47.0)
Hemoglobin: 13.2 g/dL (ref 12.0–16.0)
MCH: 27.8 pg (ref 26.0–34.0)
MCHC: 31.5 g/dL — ABNORMAL LOW (ref 32.0–36.0)
MCV: 88.2 fL (ref 80.0–100.0)
PLATELETS: 365 10*3/uL (ref 150–440)
RBC: 4.74 MIL/uL (ref 3.80–5.20)
RDW: 15.2 % — ABNORMAL HIGH (ref 11.5–14.5)
WBC: 23.1 10*3/uL — AB (ref 3.6–11.0)

## 2018-01-29 LAB — BASIC METABOLIC PANEL
ANION GAP: 18 — AB (ref 5–15)
BUN: 45 mg/dL — AB (ref 6–20)
CALCIUM: 8.1 mg/dL — AB (ref 8.9–10.3)
CO2: 17 mmol/L — ABNORMAL LOW (ref 22–32)
Chloride: 102 mmol/L (ref 101–111)
Creatinine, Ser: 1.44 mg/dL — ABNORMAL HIGH (ref 0.44–1.00)
GFR calc Af Amer: 40 mL/min — ABNORMAL LOW (ref >60)
GFR, EST NON AFRICAN AMERICAN: 35 mL/min — AB (ref >60)
Glucose, Bld: 164 mg/dL — ABNORMAL HIGH (ref 65–99)
POTASSIUM: 3.6 mmol/L (ref 3.5–5.1)
SODIUM: 137 mmol/L (ref 135–145)

## 2018-01-29 LAB — LACTIC ACID, PLASMA: LACTIC ACID, VENOUS: 2 mmol/L — AB (ref 0.5–1.9)

## 2018-01-29 LAB — MRSA PCR SCREENING: MRSA by PCR: NEGATIVE

## 2018-01-29 LAB — PROCALCITONIN
PROCALCITONIN: 1.98 ng/mL
PROCALCITONIN: 3.35 ng/mL

## 2018-01-29 LAB — MAGNESIUM
Magnesium: 2.2 mg/dL (ref 1.7–2.4)
Magnesium: 2.4 mg/dL (ref 1.7–2.4)

## 2018-01-29 LAB — CK: CK TOTAL: 816 U/L — AB (ref 38–234)

## 2018-01-29 MED ORDER — CEFTRIAXONE SODIUM 2 G IJ SOLR
2.0000 g | Freq: Two times a day (BID) | INTRAMUSCULAR | Status: DC
  Administered 2018-01-29: 2 g via INTRAVENOUS
  Filled 2018-01-29: qty 20
  Filled 2018-01-29: qty 2

## 2018-01-29 MED ORDER — VITAL HIGH PROTEIN PO LIQD
1000.0000 mL | ORAL | Status: DC
  Administered 2018-01-29 – 2018-02-02 (×3): 1000 mL

## 2018-01-29 MED ORDER — LABETALOL HCL 5 MG/ML IV SOLN: 10.0000 mg | INTRAVENOUS | Status: DC | PRN

## 2018-01-29 MED ORDER — ADULT MULTIVITAMIN LIQUID CH
15.0000 mL | Freq: Every day | ORAL | Status: DC
  Administered 2018-01-29 – 2018-02-05 (×8): 15 mL
  Filled 2018-01-29 (×8): qty 15

## 2018-01-29 MED ORDER — FREE WATER
100.0000 mL | Freq: Three times a day (TID) | Status: DC
  Administered 2018-01-29 – 2018-02-02 (×11): 100 mL

## 2018-01-29 MED ORDER — SODIUM CHLORIDE 0.9 % IV SOLN
2.0000 g | Freq: Two times a day (BID) | INTRAVENOUS | Status: DC
  Administered 2018-01-29 – 2018-01-30 (×2): 2 g via INTRAVENOUS
  Filled 2018-01-29 (×4): qty 2

## 2018-01-29 MED ORDER — DEXTROSE 5 % IV SOLN
10.0000 mg/kg | Freq: Two times a day (BID) | INTRAVENOUS | Status: DC
  Administered 2018-01-29 – 2018-01-30 (×3): 715 mg via INTRAVENOUS
  Filled 2018-01-29 (×4): qty 14.3

## 2018-01-29 MED ORDER — CHLORHEXIDINE GLUCONATE 0.12% ORAL RINSE (MEDLINE KIT)
15.0000 mL | Freq: Two times a day (BID) | OROMUCOSAL | Status: DC
  Administered 2018-01-29 – 2018-02-06 (×14): 15 mL via OROMUCOSAL

## 2018-01-29 MED ORDER — ORAL CARE MOUTH RINSE
15.0000 mL | OROMUCOSAL | Status: DC
  Administered 2018-01-29 – 2018-02-03 (×55): 15 mL via OROMUCOSAL

## 2018-01-29 MED ORDER — HYDRALAZINE HCL 20 MG/ML IJ SOLN
10.0000 mg | INTRAMUSCULAR | Status: DC | PRN
  Administered 2018-01-29 – 2018-01-30 (×3): 10 mg via INTRAVENOUS
  Administered 2018-02-01: 20 mg via INTRAVENOUS
  Filled 2018-01-29 (×4): qty 1

## 2018-01-29 MED ORDER — INSULIN ASPART 100 UNIT/ML ~~LOC~~ SOLN
0.0000 [IU] | SUBCUTANEOUS | Status: DC
  Administered 2018-01-29 (×2): 5 [IU] via SUBCUTANEOUS
  Administered 2018-01-29: 3 [IU] via SUBCUTANEOUS
  Administered 2018-01-30: 5 [IU] via SUBCUTANEOUS
  Administered 2018-01-30: 2 [IU] via SUBCUTANEOUS
  Administered 2018-01-30 – 2018-01-31 (×2): 8 [IU] via SUBCUTANEOUS
  Administered 2018-01-31: 3 [IU] via SUBCUTANEOUS
  Administered 2018-01-31 (×2): 5 [IU] via SUBCUTANEOUS
  Administered 2018-01-31: 3 [IU] via SUBCUTANEOUS
  Administered 2018-01-31: 5 [IU] via SUBCUTANEOUS
  Administered 2018-02-01 (×2): 3 [IU] via SUBCUTANEOUS
  Administered 2018-02-01: 5 [IU] via SUBCUTANEOUS
  Administered 2018-02-01: 3 [IU] via SUBCUTANEOUS
  Administered 2018-02-01: 5 [IU] via SUBCUTANEOUS
  Administered 2018-02-01 – 2018-02-02 (×5): 3 [IU] via SUBCUTANEOUS
  Administered 2018-02-02: 8 [IU] via SUBCUTANEOUS
  Administered 2018-02-02 – 2018-02-03 (×3): 5 [IU] via SUBCUTANEOUS
  Administered 2018-02-03 (×2): 3 [IU] via SUBCUTANEOUS
  Administered 2018-02-03: 2 [IU] via SUBCUTANEOUS
  Administered 2018-02-03 (×2): 3 [IU] via SUBCUTANEOUS
  Administered 2018-02-04: 2 [IU] via SUBCUTANEOUS
  Administered 2018-02-04 (×2): 3 [IU] via SUBCUTANEOUS
  Administered 2018-02-05: 2 [IU] via SUBCUTANEOUS
  Administered 2018-02-05: 3 [IU] via SUBCUTANEOUS
  Filled 2018-01-29 (×38): qty 1

## 2018-01-29 MED ORDER — FAMOTIDINE 20 MG PO TABS
20.0000 mg | ORAL_TABLET | Freq: Two times a day (BID) | ORAL | Status: DC
  Administered 2018-01-29 – 2018-02-02 (×10): 20 mg via ORAL
  Filled 2018-01-29 (×10): qty 1

## 2018-01-29 NOTE — Progress Notes (Signed)
Pt admission not complete at this time.  Pt brought up from the ED on a vent.  No family members were available to speak with.

## 2018-01-29 NOTE — Progress Notes (Signed)
[place] at [place] NAME: Sabrina Quinn    MR#:  [id number]  DATE OF BIRTH:  [birth year]  SUBJECTIVE:  CHIEF COMPLAINT:   Chief Complaint  Patient presents with  . Altered Mental Status  remains intubated, sedated REVIEW OF SYSTEMS:  Review of Systems  Unable to perform ROS: Intubated    DRUG ALLERGIES:   Allergies  Allergen Reactions  . Penicillins Rash   VITALS:  Blood pressure 93/67, pulse 86, temperature 99.6 F (37.6 C), temperature source Bladder, resp. rate 17, height 5\' 4"  (1.626 m), weight 97.1 kg (214 lb 1.1 oz), SpO2 100 %. PHYSICAL EXAMINATION:  Physical Exam  HENT:  Head: Normocephalic and atraumatic.  ETT in place  Eyes: Pupils are equal, round, and reactive to light. Conjunctivae and EOM are normal.  Neck: Normal range of motion. Neck supple. No tracheal deviation present. No thyromegaly present.  Cardiovascular: Normal rate, regular rhythm and normal heart sounds.  Pulmonary/Chest: Effort normal and breath sounds normal. No respiratory distress. She has no wheezes. She exhibits no tenderness.  Abdominal: Soft. Bowel sounds are normal. She exhibits no distension. There is no tenderness.  Musculoskeletal: Normal range of motion.  Neurological: No cranial nerve deficit.  Sedated on vent  Skin: Skin is warm and dry. No rash noted.  Psychiatric:  Unable to assess due to on vent   LABORATORY PANEL:  Female CBC Recent Labs  Lab 01/29/18 0539  WBC 23.1*  HGB 13.2  HCT 41.8  PLT 365   ------------------------------------------------------------------------------------------------------------------ Chemistries  Recent Labs  Lab 01/28/18 1855  01/29/18 0539  NA 138  --  137  K 5.3*  --  3.6  CL 94*  --  102  CO2 30  --  17*  GLUCOSE 177*  --  164*  BUN 36*  --  45*  CREATININE 1.35*   < > 1.44*  CALCIUM 8.5*  --  8.1*  MG  --    < > 2.4  AST 129*  --   --   ALT 96*  --   --   ALKPHOS 97  --   --     BILITOT 0.9  --   --    < > = values in this interval not displayed.   RADIOLOGY:  Dg Abd 1 View  Result Date: 01/28/2018 CLINICAL DATA:  OG tube placement. EXAM: ABDOMEN - 1 VIEW COMPARISON:  None. FINDINGS: Tip and side port of the enteric tube below the diaphragm in the stomach. No bowel dilatation in the upper abdomen. Multiple overlying monitoring devices project over the lower chest and upper abdomen. IMPRESSION: Tip and side port of the enteric tube below the diaphragm in the stomach. Electronically Signed   By: Jeb Levering M.D.   On: 01/28/2018 23:08   ASSESSMENT AND PLAN:   * Sepsis: present on admission. Continue empiric Abx. LP tomorrow. Appreciate ID & Neuro input. * Acute metabolic encephalopathy: could be due to hypoglycemia, LP tomorrow to r/o meningitis * Acute respiratory failure with hypoxia (HCC) -intubated and mechanically ventilated,mgmt per PCCM * HTN (hypertension) -stable, continue home meds * Diabetes ([place]) -sliding scale insulin with corresponding glucose checks       All the records are reviewed and case discussed with Care Management/Social Worker. Management plans discussed with the patient, nursing and they are in agreement.  CODE STATUS: Full Code  TOTAL TIME TAKING CARE OF THIS PATIENT: 15 minutes.   More than 50% of the  time was spent in counseling/coordination of care: YES  POSSIBLE D/C IN 3-4 DAYS, DEPENDING ON CLINICAL CONDITION.   Max Sane M.D on 01/29/2018 at 8:35 PM  Between 7am to 6pm - Pager - [phone]  After 6pm go to [email or web address] - Proofreader  [facility]  Office  [phone]  CC: Primary care physician; Cletis Athens, MD  Note: This dictation was prepared with Dragon dictation along with smaller phrase technology. Any transcriptional errors that result from this process are unintentional.

## 2018-01-29 NOTE — Progress Notes (Signed)
Vt to 450  FIO2 to .30

## 2018-01-29 NOTE — Consult Note (Signed)
Reason for Consult:Altered mental status Referring Physician: Alva Garnet  CC: Found down  HPI: Sabrina Quinn is an 77 y.o. female who is intubated and sedated.  Patient unable to provide any history and family not available at this time therefore all history obtained from the chart.  Patient's daughter stated that she was last known to be well the night prior to presentation.  It is unclear at this time what happened, the patient does meet sepsis criteria.  Source of infection is unclear at this time.  Patient required intubation due to hypoxia due to being so obtunded. Has remained intubated.  Past Medical History:  Diagnosis Date  . Anxiety   . Asthma   . Diabetes mellitus without complication ([place])   . H/O Clostridium difficile infection   . Hypertension     Past Surgical History:  Procedure Laterality Date  . CHOLECYSTECTOMY    . HERNIA REPAIR      Family history: Patient unable to provide due to mental status  Social History:  reports that she has never smoked. She has never used smokeless tobacco. She reports that she does not drink alcohol or use drugs.  Allergies  Allergen Reactions  . Penicillins Rash    Medications:  I have reviewed the patient's current medications. Prior to Admission:  Medications Prior to Admission  Medication Sig Dispense Refill Last Dose  . albuterol (PROVENTIL HFA;VENTOLIN HFA) 108 (90 Base) MCG/ACT inhaler Inhale 2 puffs into the lungs every 4 (four) hours as needed for wheezing or shortness of breath. 1 Inhaler 0 PRN at PRN  . ALPRAZolam (XANAX) 1 MG tablet Take 1 mg by mouth 2 (two) times daily.  0 Unknown at Unknown  . amiloride-hydrochlorothiazide (MODURETIC) 5-50 MG tablet Take 1 tablet by mouth daily.   Unknown at Unknown  . atorvastatin (LIPITOR) 20 MG tablet Take 20 mg by mouth daily.   Unknown at Unknown  . carbidopa-levodopa (SINEMET IR) 25-100 MG tablet Take 1 tablet by mouth 4 (four) times daily.    Unknown at Unknown  . cetirizine  (ZYRTEC) 10 MG tablet Take 1 tablet (10 mg total) by mouth daily. 30 tablet 0 Unknown at Unknown  . citalopram (CELEXA) 20 MG tablet Take 20 mg by mouth daily.   Unknown at Unknown  . DILT-XR 120 MG 24 hr capsule Take 120 mg by mouth daily.  5 Unknown at Unknown  . fluticasone (FLONASE) 50 MCG/ACT nasal spray Place 1 spray into both nostrils 2 (two) times daily. 16 g 0 Unknown at Unknown  . furosemide (LASIX) 20 MG tablet Take 20 mg by mouth daily.   Unknown at Unknown  . gabapentin (NEURONTIN) 300 MG capsule Take 300 mg by mouth 3 (three) times daily.   Unknown at Unknown  . LANTUS SOLOSTAR 100 UNIT/ML Solostar Pen Inject 30 Units into the skin.  4 Unknown at Unknown  . metFORMIN (GLUCOPHAGE) 500 MG tablet Take 500 mg by mouth 2 (two) times daily.   Unknown at Unknown  . montelukast (SINGULAIR) 10 MG tablet Take 10 mg by mouth at bedtime.   Unknown at Unknown  . omeprazole (PRILOSEC) 20 MG capsule Take 20 mg by mouth daily.   Unknown at Unknown  . potassium chloride SA (KLOR-CON M20) 20 MEQ tablet Take 1 tablet (20 mEq total) by mouth daily. 7 tablet 0 Unknown at Unknown  . ondansetron (ZOFRAN ODT) 4 MG disintegrating tablet Allow 1-2 tablets to dissolve in your mouth every 8 hours as needed for nausea/vomiting (Patient  not taking: Reported on 01/28/2018) 30 tablet 0 Not Taking at Unknown time  . predniSONE (DELTASONE) 50 MG tablet Take 1 tablet (50 mg total) by mouth daily with breakfast. (Patient not taking: Reported on 01/28/2018) 5 tablet 0 Not Taking at Unknown time   Scheduled: . chlorhexidine gluconate (MEDLINE KIT)  15 mL Mouth Rinse BID  . famotidine  20 mg Oral BID  . free water  100 mL Per Tube Q8H  . insulin aspart  0-15 Units Subcutaneous Q4H  . mouth rinse  15 mL Mouth Rinse 10 times per day  . multivitamin  15 mL Per Tube Daily    ROS: Unable to provide  Physical Examination: Blood pressure (!) 152/96, pulse (!) 104, temperature 99.9 F (37.7 C), resp. rate (!) 23, height 5'  4" (1.626 m), weight 97.1 kg (214 lb 1.1 oz), SpO2 99 %.  HEENT-  Normocephalic, no lesions, without obvious abnormality.  Normal external eye and conjunctiva.  Normal TM's bilaterally.  Normal auditory canals and external ears. Normal external nose, mucus membranes and septum.  Normal pharynx. Cardiovascular- S1, S2 normal, pulses palpable throughout   Lungs- chest clear, no wheezing, rales, normal symmetric air entry Abdomen- soft, non-tender; bowel sounds normal; no masses,  no organomegaly Extremities- no edema Lymph-no adenopathy palpable Musculoskeletal-no joint tenderness, deformity or swelling Skin-warm and dry, no hyperpigmentation, vitiligo, or suspicious lesions  Neurological Examination   Mental Status: Opens eyes with sternal rub.  Does not follow commands.  Intubated therefore no speech.   Cranial Nerves: II: patient does not respond confrontation bilaterally, pupils right 4 mm, left 4 mm,and reactive bilaterally III,IV,VI: doll's response present bilaterally.  V,VII: corneal reflex absent bilaterally  VIII: patient does not respond to verbal stimuli IX,X: gag reflex unable to be tested, XI: trapezius strength unable to test bilaterally XII: tongue strength unable to test Motor: LE's extended with increased tone bilaterally.  No spontaneous movement noted.   Sensory: Responds to noxious stimuli in all extremities. Deep Tendon Reflexes:  2+ throughout. Plantars: upgoing bilaterally Cerebellar: Unable to perform   Laboratory Studies:   Basic Metabolic Panel: Recent Labs  Lab 01/28/18 1855 01/28/18 2319 01/29/18 0539  NA 138  --  137  K 5.3*  --  3.6  CL 94*  --  102  CO2 30  --  17*  GLUCOSE 177*  --  164*  BUN 36*  --  45*  CREATININE 1.35* 1.40* 1.44*  CALCIUM 8.5*  --  8.1*  MG  --  2.2 2.4  PHOS  --  3.4 3.9    Liver Function Tests: Recent Labs  Lab 01/28/18 1855  AST 129*  ALT 96*  ALKPHOS 97  BILITOT 0.9  PROT 7.4  ALBUMIN 3.5   No  results for input(s): LIPASE, AMYLASE in the last 168 hours. No results for input(s): AMMONIA in the last 168 hours.  CBC: Recent Labs  Lab 01/28/18 1855 01/28/18 2319 01/29/18 0539  WBC 14.9* 23.7* 23.1*  NEUTROABS 13.6*  --   --   HGB 14.9 14.6 13.2  HCT 45.9 46.2 41.8  MCV 88.3 87.9 88.2  PLT 472* 410 365    Cardiac Enzymes: Recent Labs  Lab 01/28/18 1855 01/29/18 0703  CKTOTAL  --  816*  TROPONINI <0.03  --     BNP: Invalid input(s): POCBNP  CBG: Recent Labs  Lab 01/29/18 0131 01/29/18 0226 01/29/18 0337 01/29/18 0530 01/29/18 0727  GLUCAP 140* 133* 149* 192* 163*    Microbiology:  Results for orders placed or performed during the hospital encounter of 01/28/18  MRSA PCR Screening     Status: None   Collection Time: 01/28/18 11:19 PM  Result Value Ref Range Status   MRSA by PCR NEGATIVE NEGATIVE Final    Comment:        The GeneXpert MRSA Assay (FDA approved for NASAL specimens only), is one component of a comprehensive MRSA colonization surveillance program. It is not intended to diagnose MRSA infection nor to guide or monitor treatment for MRSA infections. Performed at [facility], [address] [place],  [address]     Coagulation Studies: Recent Labs    01/28/18 1855  LABPROT 12.9  INR 0.98    Urinalysis:  Recent Labs  Lab 01/28/18 1855  COLORURINE YELLOW*  LABSPEC 1.006  PHURINE 7.0  GLUCOSEU NEGATIVE  HGBUR LARGE*  BILIRUBINUR NEGATIVE  KETONESUR NEGATIVE  PROTEINUR NEGATIVE  NITRITE NEGATIVE  LEUKOCYTESUR NEGATIVE    Lipid Panel:  No results found for: CHOL, TRIG, HDL, CHOLHDL, VLDL, LDLCALC  HgbA1C: No results found for: HGBA1C  Urine Drug Screen:      Component Value Date/Time   LABOPIA NONE DETECTED 01/28/2018 1855   COCAINSCRNUR NONE DETECTED 01/28/2018 1855   LABBENZ POSITIVE (A) 01/28/2018 1855   AMPHETMU NONE DETECTED 01/28/2018 1855   THCU NONE DETECTED 01/28/2018 1855   LABBARB NONE  DETECTED 01/28/2018 1855    Alcohol Level:  Recent Labs  Lab 01/28/18 1855  ETH <10    Other results: EKG: sinus rhythm at 96 bpm.  Imaging: Dg Abd 1 View  Result Date: 01/28/2018 CLINICAL DATA:  OG tube placement. EXAM: ABDOMEN - 1 VIEW COMPARISON:  None. FINDINGS: Tip and side port of the enteric tube below the diaphragm in the stomach. No bowel dilatation in the upper abdomen. Multiple overlying monitoring devices project over the lower chest and upper abdomen. IMPRESSION: Tip and side port of the enteric tube below the diaphragm in the stomach. Electronically Signed   By: Jeb Levering M.D.   On: 01/28/2018 23:08   Ct Head Wo Contrast  Result Date: 01/28/2018 CLINICAL DATA:  77 year old female found unresponsive at home. EXAM: CT HEAD WITHOUT CONTRAST CT CERVICAL SPINE WITHOUT CONTRAST TECHNIQUE: Multidetector CT imaging of the head and cervical spine was performed following the standard protocol without intravenous contrast. Multiplanar CT image reconstructions of the cervical spine were also generated. COMPARISON:  None. FINDINGS: CT HEAD FINDINGS Brain: Patchy and confluent areas of decreased attenuation are noted throughout the deep and periventricular white matter of the cerebral hemispheres bilaterally, compatible with chronic microvascular ischemic disease. Physiologic calcifications are noted in the basal ganglia bilaterally. No evidence of acute infarction, hemorrhage, hydrocephalus, extra-axial collection or mass lesion/mass effect. Vascular: No hyperdense vessel or unexpected calcification. Skull: Normal. Negative for fracture or focal lesion. Sinuses/Orbits: No acute finding. Other: Intubated patient. CT CERVICAL SPINE FINDINGS Alignment: Normal. Skull base and vertebrae: No acute fracture. No primary bone lesion or focal pathologic process. Soft tissues and spinal canal: No prevertebral fluid or swelling. No visible canal hematoma. 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 Mild multilevel degenerative disc disease, most severe at C7-T1 and T1-T2. Mild multilevel facet arthropathy. Upper chest: Intubated patient. Other: None. IMPRESSION: 1. No acute intracranial abnormalities. 2. No signs of acute traumatic injury to the cervical spine. 3. Mild cerebral atrophy. Electronically Signed   By: Vinnie Langton M.D.   On: 01/28/2018 19:48   Ct Cervical Spine Wo Contrast  Result Date: 01/28/2018 CLINICAL DATA:  77 year old female found unresponsive at home. EXAM: CT HEAD WITHOUT CONTRAST CT CERVICAL SPINE WITHOUT CONTRAST TECHNIQUE: Multidetector CT imaging of the head and cervical spine was performed following the standard protocol without intravenous contrast. Multiplanar CT image reconstructions of the cervical spine were also generated. COMPARISON:  None. FINDINGS: CT HEAD FINDINGS Brain: Patchy and confluent areas of decreased attenuation are noted throughout the deep and periventricular white matter of the cerebral hemispheres bilaterally, compatible with chronic microvascular ischemic disease. Physiologic calcifications are noted in the basal ganglia bilaterally. No evidence of acute infarction, hemorrhage, hydrocephalus, extra-axial collection or mass lesion/mass effect. Vascular: No hyperdense vessel or unexpected calcification. Skull: Normal. Negative for fracture or focal lesion. Sinuses/Orbits: No acute finding. Other: Intubated patient. CT CERVICAL SPINE FINDINGS Alignment: Normal. Skull base and vertebrae: No acute fracture. No primary bone lesion or focal pathologic process. Soft tissues and spinal canal: No prevertebral fluid or swelling. No visible canal hematoma. 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 multilevel degenerative disc disease, most severe at C7-T1 and T1-T2. Mild multilevel facet arthropathy. Upper chest: Intubated patient. Other: None. IMPRESSION: 1. No acute intracranial abnormalities. 2. No signs of acute traumatic injury to  the cervical spine. 3. Mild cerebral atrophy. Electronically Signed   By: Vinnie Langton M.D.   On: 01/28/2018 19:48   Dg Chest Portable 1 View  Result Date: 01/28/2018 CLINICAL DATA:  Acute respiratory failure.  Intubation. EXAM: PORTABLE CHEST 1 VIEW COMPARISON:  01/19/2018 FINDINGS: A new endotracheal tube is seen with tip approximately 5 cm above the carina. Both lungs are clear. Heart size is within normal limits. No evidence of pneumothorax or pleural effusion. IMPRESSION: Endotracheal tube in appropriate position.  No active lung disease. Electronically Signed   By: Earle Gell M.D.   On: 01/28/2018 19:20     Assessment/Plan: 77 year old female presenting after being found down.  Etiology unclear but there is concern for sepsis with white blood cell count being elevated, fever, elevated procalcitonin.  No source identified with normal CXR and UA.    Recommendations: 1. Agree with need for LP.  Patient will need to have Lovenox discontinued.  LP will not be able to be safely performed until tomorrow.  In the meantime would continue antibiotics and start Acyclovir.  Pharmacy to assist with dosing.   2. EEG   Alexis Goodell, MD Neurology [phone] 01/29/2018, 9:55 AM

## 2018-01-29 NOTE — Consult Note (Addendum)
Pharmacy Antibiotic Note  Sabrina Quinn is a 77 y.o. female admitted on 01/28/2018 with Sepsis and possible meningitis. Patient initially started on Vancomycin and Cefepime. Cefepime was then switched to ceftriaxone.   Pharmacy now consulted for Meropenem and Acyclovir dosing. Patient has reported allergy of Rash to PCNs.   Plan: 1. Continue Vancomycin 1250 IV every 24 hours.  Goal trough 15-20 mcg/mL. Trough level ordered prior to 4th dose. Will monitor renal function and adjust dose as needed.   2. Start Meropenem 2g IV every 12 hours based on patient's current CrCl 26- 31mL/min.   3. Start Acyclovir 10mg /kg (BMI >30 so will use Adj BW of 71.7kg) every 12 hours based on current CrCl 25-33mL/min.    Height: 5\' 4"  (162.6 cm) Weight: 214 lb 1.1 oz (97.1 kg) IBW/kg (Calculated) : 54.7  Temp (24hrs), Avg:102.1 F (38.9 C), Min:99.9 F (37.7 C), Max:103.3 F (39.6 C)  Recent Labs  Lab 01/28/18 1855 01/28/18 1904 01/28/18 2130 01/28/18 2319 01/29/18 0539  WBC 14.9*  --   --  23.7* 23.1*  CREATININE 1.35*  --   --  1.40* 1.44*  LATICACIDVEN  --  2.5* 2.5*  --  2.0*    Estimated Creatinine Clearance: 38.2 mL/min (A) (by C-G formula based on SCr of 1.44 mg/dL (H)).    Allergies  Allergen Reactions  . Penicillins Rash    Antimicrobials this admission: 6/12 cefepime >> 6/13 6/13 Ceftriaxone>> 6/13 6/12 Vancomycin>> 6/12 meropenem>> 6/12 Acyclovir >>   Dose adjustments this admission:  Microbiology results: 6/12 BCx: NG TD 6/12 UCx: pending   6/12 MRSA PCR: negative   Thank you for allowing pharmacy to be a part of this patient's care.  Pernell Dupre, PharmD, BCPS Clinical Pharmacist 01/29/2018 11:03 AM

## 2018-01-29 NOTE — Progress Notes (Signed)
PULMONARY / CRITICAL CARE MEDICINE   Name: Sabrina Quinn MRN: [id number] DOB: [birth year]    ADMISSION DATE:  01/28/2018  PT PROFILE: 73 F with history of DM, found by family unresponsive.  Severe hypoglycemia noted by EMS.  Cognition did not improve with dextrose infusion.  Intubated in ED for altered cognition.  High fever noted, diagnosis of severe sepsis of unknown origin given.  MAJOR EVENTS/TEST RESULTS: 6/12 admission as above 6/12 CT head and neck: No acute findings 6/13 minimally responsive off of all sedation.  High fevers persist.  Antibiotics changed to cover possible meningitis and HSV encephalitis.  Neurology consultation requested.  LP ordered but cannot be performed due to recent enoxaparin.  INDWELLING DEVICES:: ETT 06/13 >>   MICRO DATA: MRSA PCR 6/12 >> NEG Urine 6/12 >>  Resp 6/13 >>  Blood 6/12 >>   ANTIMICROBIALS:  Cefepime 6/12 >> 6/13 Vanc 6/12 >>  Meropenem 6/13 >>  Acyclovir 6/13 >>    SUBJECTIVE:  RASS -4, -5.  No spontaneous movement.  Tolerates SBT.  VITAL SIGNS: BP 135/70   Pulse 96   Temp 99.9 F (37.7 C) (Bladder)   Resp (!) 21   Ht 5\' 4"  (1.626 m)   Wt 214 lb 1.1 oz (97.1 kg)   SpO2 99%   BMI 36.74 kg/m   HEMODYNAMICS:    VENTILATOR SETTINGS: Vent Mode: PRVC FiO2 (%):  [30 %-40 %] 30 % Set Rate:  [14 bmp] 14 bmp Vt Set:  [450 mL-500 mL] 450 mL PEEP:  [5 cmH20] 5 cmH20 Plateau Pressure:  [20 cmH20] 20 cmH20  INTAKE / OUTPUT: I/O last 3 completed shifts: In: 399.1 [I.V.:151.9; IV Piggyback:247.3] Out: 1200 [Urine:1200]  PHYSICAL EXAMINATION: General: Intubated, unresponsive off of all sedation Neuro: PERRL, EOMI, no withdrawal from pain, increased LE tone, DTRs symmetric HEENT: NCAT, sclerae white Cardiovascular: Regular, no M Lungs: Clear anteriorly without adventitious sounds Abdomen: Soft, NT, + BS Extremities: Warm, no edema Skin: No lesions noted  LABS:  BMET Recent Labs  Lab 01/28/18 1855 01/28/18 2319  01/29/18 0539  NA 138  --  137  K 5.3*  --  3.6  CL 94*  --  102  CO2 30  --  17*  BUN 36*  --  45*  CREATININE 1.35* 1.40* 1.44*  GLUCOSE 177*  --  164*    Electrolytes Recent Labs  Lab 01/28/18 1855 01/28/18 2319 01/29/18 0539  CALCIUM 8.5*  --  8.1*  MG  --  2.2 2.4  PHOS  --  3.4 3.9    CBC Recent Labs  Lab 01/28/18 1855 01/28/18 2319 01/29/18 0539  WBC 14.9* 23.7* 23.1*  HGB 14.9 14.6 13.2  HCT 45.9 46.2 41.8  PLT 472* 410 365    Coag's Recent Labs  Lab 01/28/18 1855  INR 0.98    Sepsis Markers Recent Labs  Lab 01/28/18 1904 01/28/18 2130 01/28/18 2319 01/29/18 0539  LATICACIDVEN 2.5* 2.5*  --  2.0*  PROCALCITON  --   --  1.98 3.35    ABG Recent Labs  Lab 01/28/18 1951 01/29/18 0449  PHART 7.50* 7.50*  PCO2ART 38 35  PO2ART 148* 108    Liver Enzymes Recent Labs  Lab 01/28/18 1855  AST 129*  ALT 96*  ALKPHOS 97  BILITOT 0.9  ALBUMIN 3.5    Cardiac Enzymes Recent Labs  Lab 01/28/18 1855  TROPONINI <0.03    Glucose Recent Labs  Lab 01/29/18 0020 01/29/18 0131 01/29/18 0226 01/29/18 0337 01/29/18  0530 01/29/18 0727  GLUCAP 153* 140* 133* 149* 192* 163*    CXR: No acute findings    ASSESSMENT / PLAN:  PULMONARY A: Ventilator dependent respiratory failure, intubated for airway protection P:   Cont vent support - settings reviewed and/or adjusted Cont vent bundle Daily SBT if/when meets criteria  Cannot be extubated until mental status improves  CARDIOVASCULAR A:  No acute issues P:  Hemodynamic monitoring MAP goal >65 mmHg  RENAL A:   CKD - Cr appears to be at baseline Mild hyperkalemia, resolved Mild metabolic acidosis P:   Monitor BMET intermittently Monitor I/Os Correct electrolytes as indicated   GASTROINTESTINAL A:   Mild elevation in transaminases of unclear etiology P:   SUP: Enteral famotidine Continue TF protocol  HEMATOLOGIC A:   No acute issues P:  DVT px: SCDs (enoxaparin  held for LP planned 6/14) Monitor CBC intermittently Transfuse per usual guidelines   INFECTIOUS A:   Severe sepsis Leukocytosis Elevated PCT Concern for meningitis or encephalitis P:   Monitor temp, WBC count Micro and abx as above Infectious disease consultation requested LP requested and planned for 6/14  ENDOCRINE A:   DM 2 Severe hypoglycemia, resolved P:   Moderate scale SSI initiated 6/13  NEUROLOGIC A:   Coma ICU/ventilator associated discomfort P:   RASS goal: 0, -1 Minimize sedating medications Neurology consultation appreciated LP planned We will use propofol as needed for discomfort Daily WUA   FAMILY  - Updates: No family at bedside presently   CCM time: 45 mins The above time includes time spent in consultation with patient and/or family members and reviewing care plan on multidisciplinary rounds.  This time also includes discussion regarding scheduling of LP with interventional radiology, discussion with neurology and infectious disease consultants  Merton Border, MD PCCM service Mobile [phone] Pager [phone]    01/29/2018, 11:39 AM

## 2018-01-29 NOTE — Progress Notes (Addendum)
Initial Nutrition Assessment  DOCUMENTATION CODES:   Obesity unspecified  INTERVENTION:  Initiate Vital High Protein at 55 mL/hr (1320 mL goal daily volume) per tube. Provides 1320 kcal, 116 grams protein, 148 grams carbohydrates, 1109 mL H2O daily.  Discontinued Pro-Stat.  Provide liquid MVI daily per tube as goal TF regimen does not meet 100% RDIs for vitamins/minerals.  With free water flush 100 mL Q8hrs patient is receiving a total of 1409 mL H2O daily including water in tube feeding.  NUTRITION DIAGNOSIS:   Inadequate oral intake related to inability to eat as evidenced by NPO status.  GOAL:   Provide needs based on ASPEN/SCCM guidelines  MONITOR:   Vent status, Labs, Weight trends, TF tolerance, I & O's  REASON FOR ASSESSMENT:   Ventilator, Consult Enteral/tube feeding initiation and management  ASSESSMENT:   77 year old female with PMHx of anxiety, asthma, HTN, DM type 2, hx of C. Difficile infection who presented after being found unresponsive at home by family and found to have sepsis from possible urinary source, hypoglycemia, and acute encephalopathy requiring intubation for airway protection on 6/12.   -Patient underwent CT head and CT cervical spine on 6/12. No acute intracranial abnormalities or acute traumatic injury found to cervical spine. Mild cerebral atrophy found. -Plan is for EEG today. Plan will be for LP once it has been >/=24 hours since Lovenox dose.  Patient is on mechanical ventilation but not requiring any continuous sedation. Currently on PRVC mode with FiO2 30%. Cooling blanket in place as patient has been febrile. No family members present at time of RD assessment. Weight appears stable in chart. Patient has been 213-216 lbs the past 1.5 years.  Access: OGT placed 6/12; terminates in stomach per abdominal x-ray 6/12; cm markings not yet documented in chart  MAP: 68-126 mmHg  TF: patient was initiated on Vital High Protein at 40 mL/hr +  Pro-Stat 30 mL BID per tube per adult TF protocol; tolerating tube feeds  Patient is currently intubated on ventilator support Ve: 10.7 L/min Temp (24hrs), Avg:102 F (38.9 C), Min:99.9 F (37.7 C), Max:103.3 F (39.6 C)  Propofol: N/A  Medications reviewed and include: famotidine, free water flush 100 mL Q8hrs, Novolog 0-15 units Q4hrs, acyclovir, meropenem, propofol gtt now off, vancomycin.  Labs reviewed: CBG 133-192, CO2 17, BUN 45, Creatinine 1.44, Anion gap 18. Potassium, Phosphorus, and Magnesium have all been WNL. Agree with discontinuation of serial Magnesium and Phosphorus lab checks as patient is not at risk for refeeding syndrome.  I/O: 1200 mL UOP overnight  Patient does not meet criteria for malnutrition at this time.  Discussed with RN and on rounds. Patient is not currently requiring any sedation and is unresponsive.  NUTRITION - FOCUSED PHYSICAL EXAM:    Most Recent Value  Orbital Region  No depletion  Upper Arm Region  No depletion  Thoracic and Lumbar Region  No depletion  Buccal Region  Unable to assess  Temple Region  No depletion  Clavicle Bone Region  No depletion  Clavicle and Acromion Bone Region  No depletion  Scapular Bone Region  Unable to assess  Dorsal Hand  No depletion  Patellar Region  No depletion  Anterior Thigh Region  No depletion  Posterior Calf Region  No depletion  Edema (RD Assessment)  Mild [bilateral lower extremities]  Hair  Reviewed  Eyes  Unable to assess  Mouth  Unable to assess  Skin  Reviewed  Nails  Reviewed     Diet Order:  Diet Order    None      EDUCATION NEEDS:   No education needs have been identified at this time  Skin:  Skin Assessment: Reviewed RN Assessment(ecchymosis to bilateral arms)  Last BM:  Unknown/PTA  Height:   Ht Readings from Last 1 Encounters:  01/28/18 5\' 4"  (1.626 m)    Weight:   Wt Readings from Last 1 Encounters:  01/29/18 214 lb 1.1 oz (97.1 kg)    Ideal Body Weight:   54.5 kg  BMI:  Body mass index is 36.74 kg/m.  Estimated Nutritional Needs:   Kcal:  2458-0998 (11-14 kcal/kg)  Protein:  >/= 109 grams (>/= 2 grams/kg IBW)  Fluid:  1.4-1.6 L/day (25-30 mL/kg IBW)  Willey Blade, MS, RD, LDN Office: [phone] Pager: [phone] After Hours/Weekend Pager: [phone]

## 2018-01-29 NOTE — Consult Note (Signed)
[facility] Infectious Disease     Reason for Consult: Sepsis    Referring Physician: Elder Cyphers Date of Admission:  01/28/2018   Principal Problem:   Sepsis ([place]) Active Problems:   Acute respiratory failure with hypoxia (HCC)   HTN (hypertension)   Asthma   Diabetes ([place])   Hyperkalemia   Altered mental status   HPI: Sabrina Quinn is a 77 y.o. female Admitted after being found unresponsive at home.  Last seen night prior.  On admission she was intubated due to hypoxia and obtundation.  Her initial temperature was 103 and white blood count 14.9.  She was started on IV vancomycin and cefepime.  She also had mildly elevated AST and ALT, lactic acid of 2.5 urinalysis with 0-5 white cells.  Tox screen positive for benzos.  Blood cultures are pending.  She was unable to have a lumbar puncture because of use of anticoagulants.  Her chest x-ray showed no active lung disease.  CT of the C-spine showed no acute intracranial abnormalities.  CT of the head was negative.  Abdominal xray showed no acute findings.  Over the last 24 hours since admission her fever curve is decreased, white count spiked a 23.7 but is now down to 23.1.  Acyclovir and ceftriaxone were added.  She has been seen by neurology.  She has a history of hypertension, diabetes, asthma, anxiety and a prior history of C. difficile.  Of note she had been seen in the emergency room on June 3 and diagnosed with bronchitis.  At that visit she had no fevers.  Chest x-ray done on June 3 was negative.  No apparent labs were noted at that time.  ED note reports that she was having increasing coughing and shortness of breath she was discharged home with Zyrtec Flonase 5-day course of steroids.  Past Medical History:  Diagnosis Date  . Anxiety   . Asthma   . Diabetes mellitus without complication ([place])   . H/O Clostridium difficile infection   . Hypertension    Past Surgical History:  Procedure Laterality Date  . CHOLECYSTECTOMY    .  HERNIA REPAIR     Social History   Tobacco Use  . Smoking status: Never Smoker  . Smokeless tobacco: Never Used  Substance Use Topics  . Alcohol use: No  . Drug use: No   No family history on file.  Allergies:  Allergies  Allergen Reactions  . Penicillins Rash    Current antibiotics: Antibiotics Given (last 72 hours)    Date/Time Action Medication Dose Rate   01/28/18 2036 New Bag/Given   ceFEPIme (MAXIPIME) 2 g in sodium chloride 0.9 % 100 mL IVPB 2 g 200 mL/hr   01/28/18 2048 New Bag/Given   vancomycin (VANCOCIN) IVPB 1000 mg/200 mL premix 1,000 mg 200 mL/hr   01/29/18 0431 New Bag/Given   vancomycin (VANCOCIN) 1,250 mg in sodium chloride 0.9 % 250 mL IVPB 1,250 mg 166.7 mL/hr   01/29/18 0815 New Bag/Given   ceFEPIme (MAXIPIME) 2 g in sodium chloride 0.9 % 100 mL IVPB 2 g 200 mL/hr   01/29/18 1000 New Bag/Given   cefTRIAXone (ROCEPHIN) 2 g in sodium chloride 0.9 % 100 mL IVPB 2 g 200 mL/hr   01/29/18 1137 New Bag/Given   acyclovir (ZOVIRAX) 715 mg in dextrose 5 % 150 mL IVPB 715 mg 164.3 mL/hr      MEDICATIONS: . chlorhexidine gluconate (MEDLINE KIT)  15 mL Mouth Rinse BID  . famotidine  20 mg  Oral BID  . free water  100 mL Per Tube Q8H  . insulin aspart  0-15 Units Subcutaneous Q4H  . mouth rinse  15 mL Mouth Rinse 10 times per day  . multivitamin  15 mL Per Tube Daily   Review of Systems -unable to obtain  OBJECTIVE: Temp:  [99.6 F (37.6 C)-103.3 F (39.6 C)] 100 F (37.8 C) (06/13 1400) Pulse Rate:  [86-104] 100 (06/13 1420) Resp:  [15-25] 16 (06/13 1420) BP: (113-180)/(42-119) 136/70 (06/13 1420) SpO2:  [97 %-100 %] 98 % (06/13 1420) FiO2 (%):  [30 %-40 %] 30 % (06/13 1400) Weight:  [94.9 kg (209 lb 3.5 oz)-98 kg (216 lb)] 97.1 kg (214 lb 1.1 oz) (06/13 0500) Physical Exam  Constitutional:  Intubated, sedated HENT: [place]/AT, PERRLA, no scleral icterus, OGT Mouth/Throat: ETT, OGT Cardiovascular: Normal rate, regular rhythm and normal heart  sounds. Pulmonary/Chest: mech BS.  Neck = supple, no nuchal rigidity Abdominal: Soft. Bowel sounds are normal.  exhibits no distension. There is no tenderness.  Lymphadenopathy: no cervical adenopathy. No axillary adenopathy Neurological: intubated and sedated Skin: Skin is warm and dry. No rash noted. No erythema.  Ext 1+ edema Psychiatric: sedated   LABS: Results for orders placed or performed during the hospital encounter of 01/28/18 (from the past 48 hour(s))  Comprehensive metabolic panel     Status: Abnormal   Collection Time: 01/28/18  6:55 PM  Result Value Ref Range   Sodium 138 135 - 145 mmol/L   Potassium 5.3 (H) 3.5 - 5.1 mmol/L    Comment: HEMOLYSIS AT THIS LEVEL MAY AFFECT RESULT   Chloride 94 (L) 101 - 111 mmol/L   CO2 30 22 - 32 mmol/L   Glucose, Bld 177 (H) 65 - 99 mg/dL   BUN 36 (H) 6 - 20 mg/dL   Creatinine, Ser 1.35 (H) 0.44 - 1.00 mg/dL   Calcium 8.5 (L) 8.9 - 10.3 mg/dL   Total Protein 7.4 6.5 - 8.1 g/dL   Albumin 3.5 3.5 - 5.0 g/dL   AST 129 (H) 15 - 41 U/L   ALT 96 (H) 14 - 54 U/L   Alkaline Phosphatase 97 38 - 126 U/L   Total Bilirubin 0.9 0.3 - 1.2 mg/dL   GFR calc non Af Amer 37 (L) >60 mL/min   GFR calc Af Amer 43 (L) >60 mL/min    Comment: (NOTE) The eGFR has been calculated using the CKD EPI equation. This calculation has not been validated in all clinical situations. eGFR's persistently <60 mL/min signify possible Chronic Kidney Disease.    Anion gap 14 5 - 15    Comment: Performed at [facility], [address] [place], [place] [address]  Ethanol     Status: None   Collection Time: 01/28/18  6:55 PM  Result Value Ref Range   Alcohol, Ethyl (B) <10 <10 mg/dL    Comment: (NOTE) Lowest detectable limit for serum alcohol is 10 mg/dL. For medical purposes only. Performed at [facility], [address] [place], [place] [address]   Troponin I     Status: None   Collection Time: 01/28/18  6:55 PM  Result Value Ref  Range   Troponin I <0.03 <0.03 ng/mL    Comment: Performed at [facility], [address] [place], [place] [address]  CBC with Differential     Status: Abnormal   Collection Time: 01/28/18  6:55 PM  Result Value Ref Range   WBC 14.9 (H) 3.6 - 11.0  K/uL   RBC 5.20 3.80 - 5.20 MIL/uL   Hemoglobin 14.9 12.0 - 16.0 g/dL   HCT 45.9 35.0 - 47.0 %   MCV 88.3 80.0 - 100.0 fL   MCH 28.7 26.0 - 34.0 pg   MCHC 32.4 32.0 - 36.0 g/dL   RDW 15.4 (H) 11.5 - 14.5 %   Platelets 472 (H) 150 - 440 K/uL    Comment: PLATELET COUNT CONFIRMED BY SMEAR   Neutrophils Relative % 92 %   Neutro Abs 13.6 (H) 1.4 - 6.5 K/uL   Lymphocytes Relative 4 %   Lymphs Abs 0.6 (L) 1.0 - 3.6 K/uL   Monocytes Relative 4 %   Monocytes Absolute 0.6 0.2 - 0.9 K/uL   Eosinophils Relative 0 %   Eosinophils Absolute 0.0 0 - 0.7 K/uL   Basophils Relative 0 %   Basophils Absolute 0.0 0 - 0.1 K/uL    Comment: Performed at [facility], [address] [place], [place] [address]  Protime-INR     Status: None   Collection Time: 01/28/18  6:55 PM  Result Value Ref Range   Prothrombin Time 12.9 11.4 - 15.2 seconds   INR 0.98     Comment: Performed at [facility], [address] [place], [place] [address]  Urinalysis, Complete w Microscopic     Status: Abnormal   Collection Time: 01/28/18  6:55 PM  Result Value Ref Range   Color, Urine YELLOW (A) YELLOW   APPearance HAZY (A) CLEAR   Specific Gravity, Urine 1.006 1.005 - 1.030   pH 7.0 5.0 - 8.0   Glucose, UA NEGATIVE NEGATIVE mg/dL   Hgb urine dipstick LARGE (A) NEGATIVE   Bilirubin Urine NEGATIVE NEGATIVE   Ketones, ur NEGATIVE NEGATIVE mg/dL   Protein, ur NEGATIVE NEGATIVE mg/dL   Nitrite NEGATIVE NEGATIVE   Leukocytes, UA NEGATIVE NEGATIVE   RBC / HPF 21-50 0 - 5 RBC/hpf   WBC, UA 0-5 0 - 5 WBC/hpf   Bacteria, UA MANY (A) NONE SEEN   Squamous Epithelial / LPF NONE SEEN 0 - 5    Comment: Performed at [facility], [address], [place], [place] [address]  Urine Drug Screen, Qualitative     Status: Abnormal   Collection Time: 01/28/18  6:55 PM  Result Value Ref Range   Tricyclic, Ur Screen NONE DETECTED NONE DETECTED   Amphetamines, Ur Screen NONE DETECTED NONE DETECTED   MDMA (Ecstasy)Ur Screen NONE DETECTED NONE DETECTED   Cocaine Metabolite,Ur [place] NONE DETECTED NONE DETECTED   Opiate, Ur Screen NONE DETECTED NONE DETECTED   Phencyclidine (PCP) Ur S NONE DETECTED NONE DETECTED   Cannabinoid 50 Ng, Ur [place] NONE DETECTED NONE DETECTED   Barbiturates, Ur Screen NONE DETECTED NONE DETECTED   Benzodiazepine, Ur Scrn POSITIVE (A) NONE DETECTED   Methadone Scn, Ur NONE DETECTED NONE DETECTED    Comment: (NOTE) Tricyclics + metabolites, urine    Cutoff 1000 ng/mL Amphetamines + metabolites, urine  Cutoff 1000 ng/mL MDMA (Ecstasy), urine              Cutoff 500 ng/mL Cocaine Metabolite, urine          Cutoff 300 ng/mL Opiate + metabolites, urine        Cutoff 300 ng/mL Phencyclidine (PCP), urine         Cutoff 25 ng/mL Cannabinoid, urine                 Cutoff 50 ng/mL Barbiturates +  metabolites, urine  Cutoff 200 ng/mL Benzodiazepine, urine              Cutoff 200 ng/mL Methadone, urine                   Cutoff 300 ng/mL The urine drug screen provides only a preliminary, unconfirmed analytical test result and should not be used for non-medical purposes. Clinical consideration and professional judgment should be applied to any positive drug screen result due to possible interfering substances. A more specific alternate chemical method must be used in order to obtain a confirmed analytical result. Gas chromatography / mass spectrometry (GC/MS) is the preferred confirmat ory method. Performed at [facility], [address] [place], [place] [address]   Lactic acid, plasma     Status: Abnormal   Collection Time: 01/28/18  7:04 PM  Result Value Ref Range   Lactic Acid, Venous 2.5 (HH) 0.5 - 1.9  mmol/L    Comment: CRITICAL RESULT CALLED TO, READ BACK BY AND VERIFIED WITH SHANNON MARTIN '@2022'  01/28/18 FLC Performed at [facility], [address] [place], [place] [address]   Glucose, capillary     Status: Abnormal   Collection Time: 01/28/18  7:18 PM  Result Value Ref Range   Glucose-Capillary 192 (H) 65 - 99 mg/dL   Comment 1 Notify RN    Comment 2 Document in Chart   Blood gas, arterial     Status: Abnormal   Collection Time: 01/28/18  7:51 PM  Result Value Ref Range   FIO2 0.40    Delivery systems VENTILATOR    Mode ASSIST CONTROL    VT 500 mL   Peep/cpap 5.0 cm H20   pH, Arterial 7.50 (H) 7.350 - 7.450   pCO2 arterial 38 32.0 - 48.0 mmHg   pO2, Arterial 148 (H) 83.0 - 108.0 mmHg   Bicarbonate 29.6 (H) 20.0 - 28.0 mmol/L   Acid-Base Excess 6.1 (H) 0.0 - 2.0 mmol/L   O2 Saturation 99.4 %   Patient temperature 37.0    Collection site LEFT RADIAL    Sample type ARTERIAL DRAW    Allens test (pass/fail) PASS PASS   Mechanical Rate 14     Comment: Performed at [facility], [address] [place], [place] [address]  Lactic acid, plasma     Status: Abnormal   Collection Time: 01/28/18  9:30 PM  Result Value Ref Range   Lactic Acid, Venous 2.5 (HH) 0.5 - 1.9 mmol/L    Comment: CRITICAL RESULT CALLED TO, READ BACK BY AND VERIFIED WITH BARBARA THAO ON 01/28/18 AT 2225 JAG Performed at [facility], [address] [place], [place] [address]   CULTURE, BLOOD (ROUTINE X 2) w Reflex to ID Panel     Status: None (Preliminary result)   Collection Time: 01/28/18  9:30 PM  Result Value Ref Range   Specimen Description BLOOD LEFT ANTECUBITAL    Special Requests      BOTTLES DRAWN AEROBIC AND ANAEROBIC Blood Culture adequate volume   Culture      NO GROWTH < 12 HOURS Performed at [facility], [address] [place], [place] [address]    Report Status PENDING   CULTURE, BLOOD (ROUTINE X 2) w Reflex to ID Panel     Status: None  (Preliminary result)   Collection Time: 01/28/18  9:45 PM  Result Value Ref Range   Specimen Description BLOOD BLOOD LEFT ARM    Special Requests  BOTTLES DRAWN AEROBIC AND ANAEROBIC Blood Culture adequate volume   Culture      NO GROWTH < 12 HOURS Performed at [facility], [address] [place], [place] [address]    Report Status PENDING   Glucose, capillary     Status: None   Collection Time: 01/28/18 10:34 PM  Result Value Ref Range   Glucose-Capillary 65 65 - 99 mg/dL  MRSA PCR Screening     Status: None   Collection Time: 01/28/18 11:19 PM  Result Value Ref Range   MRSA by PCR NEGATIVE NEGATIVE    Comment:        The GeneXpert MRSA Assay (FDA approved for NASAL specimens only), is one component of a comprehensive MRSA colonization surveillance program. It is not intended to diagnose MRSA infection nor to guide or monitor treatment for MRSA infections. Performed at [facility], [address] [place], [place] [address]   Procalcitonin - Baseline     Status: None   Collection Time: 01/28/18 11:19 PM  Result Value Ref Range   Procalcitonin 1.98 ng/mL    Comment:        Interpretation: PCT > 0.5 ng/mL and <= 2 ng/mL: Systemic infection (sepsis) is possible, but other conditions are known to elevate PCT as well. (NOTE)       Sepsis PCT Algorithm           Lower Respiratory Tract                                      Infection PCT Algorithm    ----------------------------     ----------------------------         PCT < 0.25 ng/mL                PCT < 0.10 ng/mL         Strongly encourage             Strongly discourage   discontinuation of antibiotics    initiation of antibiotics    ----------------------------     -----------------------------       PCT 0.25 - 0.50 ng/mL            PCT 0.10 - 0.25 ng/mL               OR       >80% decrease in PCT            Discourage initiation of                                            antibiotics       Encourage discontinuation           of antibiotics    ----------------------------     -----------------------------         PCT >= 0.50 ng/mL              PCT 0.26 - 0.50 ng/mL                AND       <80% decrease in PCT             Encourage initiation of  antibiotics       Encourage continuation           of antibiotics    ----------------------------     -----------------------------        PCT >= 0.50 ng/mL                  PCT > 0.50 ng/mL               AND         increase in PCT                  Strongly encourage                                      initiation of antibiotics    Strongly encourage escalation           of antibiotics                                     -----------------------------                                           PCT <= 0.25 ng/mL                                                 OR                                        > 80% decrease in PCT                                     Discontinue / Do not initiate                                             antibiotics Performed at [facility], [address] [place], [place] [address]   CBC     Status: Abnormal   Collection Time: 01/28/18 11:19 PM  Result Value Ref Range   WBC 23.7 (H) 3.6 - 11.0 K/uL   RBC 5.26 (H) 3.80 - 5.20 MIL/uL   Hemoglobin 14.6 12.0 - 16.0 g/dL   HCT 46.2 35.0 - 47.0 %   MCV 87.9 80.0 - 100.0 fL   MCH 27.8 26.0 - 34.0 pg   MCHC 31.6 (L) 32.0 - 36.0 g/dL   RDW 15.4 (H) 11.5 - 14.5 %   Platelets 410 150 - 440 K/uL    Comment: Performed at [facility], [address] [place],  [address]  Creatinine, serum     Status: Abnormal   Collection Time: 01/28/18 11:19 PM  Result Value Ref Range   Creatinine, Ser 1.40 (H) 0.44 - 1.00 mg/dL   GFR calc non Af Amer 36 (L) >60 mL/min   GFR calc Af [place] Lora  41 (L) >60 mL/min    Comment: (NOTE) The eGFR has been calculated using the CKD EPI equation. This  calculation has not been validated in all clinical situations. eGFR's persistently <60 mL/min signify possible Chronic Kidney Disease. Performed at [facility], [address] [place], [place] [address]   Magnesium     Status: None   Collection Time: 01/28/18 11:19 PM  Result Value Ref Range   Magnesium 2.2 1.7 - 2.4 mg/dL    Comment: Performed at [facility], [address] [place], [place] [address]  Phosphorus     Status: None   Collection Time: 01/28/18 11:19 PM  Result Value Ref Range   Phosphorus 3.4 2.5 - 4.6 mg/dL    Comment: Performed at [facility], [address] [place], [place] [address]  Glucose, capillary     Status: Abnormal   Collection Time: 01/28/18 11:34 PM  Result Value Ref Range   Glucose-Capillary 49 (L) 65 - 99 mg/dL  Glucose, capillary     Status: Abnormal   Collection Time: 01/29/18 12:07 AM  Result Value Ref Range   Glucose-Capillary 183 (H) 65 - 99 mg/dL  Glucose, capillary     Status: Abnormal   Collection Time: 01/29/18 12:20 AM  Result Value Ref Range   Glucose-Capillary 153 (H) 65 - 99 mg/dL  Glucose, capillary     Status: Abnormal   Collection Time: 01/29/18  1:31 AM  Result Value Ref Range   Glucose-Capillary 140 (H) 65 - 99 mg/dL   Comment 1 Notify RN   Glucose, capillary     Status: Abnormal   Collection Time: 01/29/18  2:26 AM  Result Value Ref Range   Glucose-Capillary 133 (H) 65 - 99 mg/dL   Comment 1 Notify RN   Glucose, capillary     Status: Abnormal   Collection Time: 01/29/18  3:37 AM  Result Value Ref Range   Glucose-Capillary 149 (H) 65 - 99 mg/dL  Blood gas, arterial     Status: Abnormal   Collection Time: 01/29/18  4:49 AM  Result Value Ref Range   FIO2 0.30    Delivery systems VENTILATOR    Mode PRESSURE REGULATED VOLUME CONTROL    VT 450 mL   Peep/cpap 5.0 cm H20   pH, Arterial 7.50 (H) 7.350 - 7.450   pCO2 arterial 35 32.0 - 48.0 mmHg   pO2, Arterial 108 83.0 - 108.0 mmHg    Bicarbonate 27.3 20.0 - 28.0 mmol/L   Acid-Base Excess 4.3 (H) 0.0 - 2.0 mmol/L   O2 Saturation 98.6 %   Patient temperature 37.0    Collection site RIGHT RADIAL    Sample type ARTERIAL DRAW    Allens test (pass/fail) PASS PASS   Mechanical Rate 14     Comment: Performed at [facility], [address] [place], [place] [address]  Glucose, capillary     Status: Abnormal   Collection Time: 01/29/18  5:30 AM  Result Value Ref Range   Glucose-Capillary 192 (H) 65 - 99 mg/dL   Comment 1 Notify RN   Procalcitonin     Status: None   Collection Time: 01/29/18  5:39 AM  Result Value Ref Range   Procalcitonin 3.35 ng/mL    Comment:        Interpretation: PCT > 2 ng/mL: Systemic infection (sepsis) is likely, unless other causes are known. (NOTE)       Sepsis PCT Algorithm  Lower Respiratory Tract                                      Infection PCT Algorithm    ----------------------------     ----------------------------         PCT < 0.25 ng/mL                PCT < 0.10 ng/mL         Strongly encourage             Strongly discourage   discontinuation of antibiotics    initiation of antibiotics    ----------------------------     -----------------------------       PCT 0.25 - 0.50 ng/mL            PCT 0.10 - 0.25 ng/mL               OR       >80% decrease in PCT            Discourage initiation of                                            antibiotics      Encourage discontinuation           of antibiotics    ----------------------------     -----------------------------         PCT >= 0.50 ng/mL              PCT 0.26 - 0.50 ng/mL               AND       <80% decrease in PCT              Encourage initiation of                                             antibiotics       Encourage continuation           of antibiotics    ----------------------------     -----------------------------        PCT >= 0.50 ng/mL                  PCT > 0.50 ng/mL               AND          increase in PCT                  Strongly encourage                                      initiation of antibiotics    Strongly encourage escalation           of antibiotics                                     -----------------------------  PCT <= 0.25 ng/mL                                                 OR                                        > 80% decrease in PCT                                     Discontinue / Do not initiate                                             antibiotics Performed at [facility], [address] [place], [place] [address]   CBC     Status: Abnormal   Collection Time: 01/29/18  5:39 AM  Result Value Ref Range   WBC 23.1 (H) 3.6 - 11.0 K/uL   RBC 4.74 3.80 - 5.20 MIL/uL   Hemoglobin 13.2 12.0 - 16.0 g/dL   HCT 41.8 35.0 - 47.0 %   MCV 88.2 80.0 - 100.0 fL   MCH 27.8 26.0 - 34.0 pg   MCHC 31.5 (L) 32.0 - 36.0 g/dL   RDW 15.2 (H) 11.5 - 14.5 %   Platelets 365 150 - 440 K/uL    Comment: Performed at [facility], [address] [place], [place] [address]  Basic metabolic panel     Status: Abnormal   Collection Time: 01/29/18  5:39 AM  Result Value Ref Range   Sodium 137 135 - 145 mmol/L   Potassium 3.6 3.5 - 5.1 mmol/L   Chloride 102 101 - 111 mmol/L   CO2 17 (L) 22 - 32 mmol/L   Glucose, Bld 164 (H) 65 - 99 mg/dL   BUN 45 (H) 6 - 20 mg/dL   Creatinine, Ser 1.44 (H) 0.44 - 1.00 mg/dL   Calcium 8.1 (L) 8.9 - 10.3 mg/dL   GFR calc non Af Amer 35 (L) >60 mL/min   GFR calc Af Amer 40 (L) >60 mL/min    Comment: (NOTE) The eGFR has been calculated using the CKD EPI equation. This calculation has not been validated in all clinical situations. eGFR's persistently <60 mL/min signify possible Chronic Kidney Disease.    Anion gap 18 (H) 5 - 15    Comment: Performed at [facility], [address] [place], [place] [address]  Magnesium     Status: None   Collection Time:  01/29/18  5:39 AM  Result Value Ref Range   Magnesium 2.4 1.7 - 2.4 mg/dL    Comment: Performed at [facility], [address] [place], [place] [address]  Phosphorus     Status: None   Collection Time: 01/29/18  5:39 AM  Result Value Ref Range   Phosphorus 3.9 2.5 - 4.6 mg/dL    Comment: Performed at [facility], [address] [place], [place] [address]  Lactic acid, plasma     Status: Abnormal   Collection Time: 01/29/18  5:39 AM  Result Value Ref Range   Lactic  Acid, Venous 2.0 (HH) 0.5 - 1.9 mmol/L    Comment: CRITICAL RESULT CALLED TO, READ BACK BY AND VERIFIED WITH BARBARA THAO ON 01/29/18 AT 0628 JAG Performed at [facility], [address] [place], [place] [address]   CK     Status: Abnormal   Collection Time: 01/29/18  7:03 AM  Result Value Ref Range   Total CK 816 (H) 38 - 234 U/L    Comment: Performed at [facility], [address] [place], [place] [address]  Glucose, capillary     Status: Abnormal   Collection Time: 01/29/18  7:27 AM  Result Value Ref Range   Glucose-Capillary 163 (H) 65 - 99 mg/dL  Glucose, capillary     Status: Abnormal   Collection Time: 01/29/18 11:58 AM  Result Value Ref Range   Glucose-Capillary 220 (H) 65 - 99 mg/dL   No components found for: ESR, C REACTIVE PROTEIN MICRO: Recent Results (from the past 720 hour(s))  CULTURE, BLOOD (ROUTINE X 2) w Reflex to ID Panel     Status: None (Preliminary result)   Collection Time: 01/28/18  9:30 PM  Result Value Ref Range Status   Specimen Description BLOOD LEFT ANTECUBITAL  Final   Special Requests   Final    BOTTLES DRAWN AEROBIC AND ANAEROBIC Blood Culture adequate volume   Culture   Final    NO GROWTH < 12 HOURS Performed at [facility], [address] [place], [place] [address]    Report Status PENDING  Incomplete  CULTURE, BLOOD (ROUTINE X 2) w Reflex to ID Panel     Status: None (Preliminary result)   Collection Time: 01/28/18   9:45 PM  Result Value Ref Range Status   Specimen Description BLOOD BLOOD LEFT ARM  Final   Special Requests   Final    BOTTLES DRAWN AEROBIC AND ANAEROBIC Blood Culture adequate volume   Culture   Final    NO GROWTH < 12 HOURS Performed at [facility], [address], [place], [place] [address]    Report Status PENDING  Incomplete  MRSA PCR Screening     Status: None   Collection Time: 01/28/18 11:19 PM  Result Value Ref Range Status   MRSA by PCR NEGATIVE NEGATIVE Final    Comment:        The GeneXpert MRSA Assay (FDA approved for NASAL specimens only), is one component of a comprehensive MRSA colonization surveillance program. It is not intended to diagnose MRSA infection nor to guide or monitor treatment for MRSA infections. Performed at [facility], [address] [place], [place] [address]     IMAGING: Dg Chest 2 View  Result Date: 01/19/2018 CLINICAL DATA:  Productive cough for 4 days. EXAM: CHEST - 2 VIEW COMPARISON:  09/03/2015 and prior radiographs FINDINGS: The cardiomediastinal silhouette is unchanged with stable mild RIGHT hilar fullness since 2009. Mild LEFT basilar scarring again noted. There is no evidence of focal airspace disease, pulmonary edema, suspicious pulmonary nodule/mass, pleural effusion, or pneumothorax. No acute bony abnormalities are identified. IMPRESSION: No active cardiopulmonary disease. Electronically Signed   By: Margarette Canada M.D.   On: 01/19/2018 16:51   Dg Abd 1 View  Result Date: 01/28/2018 CLINICAL DATA:  OG tube placement. EXAM: ABDOMEN - 1 VIEW COMPARISON:  None. FINDINGS: Tip and side port of the enteric tube below the diaphragm in the stomach. No bowel dilatation in the upper abdomen. Multiple overlying monitoring devices project over the lower chest  and upper abdomen. IMPRESSION: Tip and side port of the enteric tube below the diaphragm in the stomach. Electronically Signed   By: Jeb Levering M.D.   On:  01/28/2018 23:08   Ct Head Wo Contrast  Result Date: 01/28/2018 CLINICAL DATA:  77 year old female found unresponsive at home. EXAM: CT HEAD WITHOUT CONTRAST CT CERVICAL SPINE WITHOUT CONTRAST TECHNIQUE: Multidetector CT imaging of the head and cervical spine was performed following the standard protocol without intravenous contrast. Multiplanar CT image reconstructions of the cervical spine were also generated. COMPARISON:  None. FINDINGS: CT HEAD FINDINGS Brain: Patchy and confluent areas of decreased attenuation are noted throughout the deep and periventricular white matter of the cerebral hemispheres bilaterally, compatible with chronic microvascular ischemic disease. Physiologic calcifications are noted in the basal ganglia bilaterally. No evidence of acute infarction, hemorrhage, hydrocephalus, extra-axial collection or mass lesion/mass effect. Vascular: No hyperdense vessel or unexpected calcification. Skull: Normal. Negative for fracture or focal lesion. Sinuses/Orbits: No acute finding. Other: Intubated patient. CT CERVICAL SPINE FINDINGS Alignment: Normal. Skull base and vertebrae: No acute fracture. No primary bone lesion or focal pathologic process. Soft tissues and spinal canal: No prevertebral fluid or swelling. No visible canal hematoma. 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 multilevel degenerative disc disease, most severe at C7-T1 and T1-T2. Mild multilevel facet arthropathy. Upper chest: Intubated patient. Other: None. IMPRESSION: 1. No acute intracranial abnormalities. 2. No signs of acute traumatic injury to the cervical spine. 3. Mild cerebral atrophy. Electronically Signed   By: Vinnie Langton M.D.   On: 01/28/2018 19:48   Ct Cervical Spine Wo Contrast  Result Date: 01/28/2018 CLINICAL DATA:  77 year old female found unresponsive at home. EXAM: CT HEAD WITHOUT CONTRAST CT CERVICAL SPINE WITHOUT CONTRAST TECHNIQUE: Multidetector CT imaging of the head and cervical  spine was performed following the standard protocol without intravenous contrast. Multiplanar CT image reconstructions of the cervical spine were also generated. COMPARISON:  None. FINDINGS: CT HEAD FINDINGS Brain: Patchy and confluent areas of decreased attenuation are noted throughout the deep and periventricular white matter of the cerebral hemispheres bilaterally, compatible with chronic microvascular ischemic disease. Physiologic calcifications are noted in the basal ganglia bilaterally. No evidence of acute infarction, hemorrhage, hydrocephalus, extra-axial collection or mass lesion/mass effect. Vascular: No hyperdense vessel or unexpected calcification. Skull: Normal. Negative for fracture or focal lesion. Sinuses/Orbits: No acute finding. Other: Intubated patient. CT CERVICAL SPINE FINDINGS Alignment: Normal. Skull base and vertebrae: No acute fracture. No primary bone lesion or focal pathologic process. Soft tissues and spinal canal: No prevertebral fluid or swelling. No visible canal hematoma. 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 multilevel degenerative disc disease, most severe at C7-T1 and T1-T2. Mild multilevel facet arthropathy. Upper chest: Intubated patient. Other: None. IMPRESSION: 1. No acute intracranial abnormalities. 2. No signs of acute traumatic injury to the cervical spine. 3. Mild cerebral atrophy. Electronically Signed   By: Vinnie Langton M.D.   On: 01/28/2018 19:48   Dg Chest Portable 1 View  Result Date: 01/28/2018 CLINICAL DATA:  Acute respiratory failure.  Intubation. EXAM: PORTABLE CHEST 1 VIEW COMPARISON:  01/19/2018 FINDINGS: A new endotracheal tube is seen with tip approximately 5 cm above the carina. Both lungs are clear. Heart size is within normal limits. No evidence of pneumothorax or pleural effusion. IMPRESSION: Endotracheal tube in appropriate position.  No active lung disease. Electronically Signed   By: Earle Gell M.D.   On: 01/28/2018 19:20     Assessment:  AMERICUS SCHEURICH is a 77 y.o. female found unresponsive after last being seen approx 24 hours prior. She had recently started steroids for a bronchitis and her sugars had been elevated so she had been increasing her insulin dosing. She finished prednisone on Monday and on Tuesday sugars were going as low as 30s and also high.  She then apparently went to bed Tuesday night, her daughter visited her midday Wed and heard her snoring so assumed she was napping, then found her later at 5 pm completely unresponsive.   Sugars in the 30s when found by EMS. She was treated and sugars improved but no improvement in MS. Intubated in ED.  Family denies any prodrome of fevers, chills, HA, neck pain, abd pain. She did have the cough over the last week. No travel, no pets. Does not leave the house much. No known sick contacts On admit wbc was 14, temp 103. CXR negative. Ct head and neck negative. She had elevated lactic acid, mildly elevated CK, ARF, UA negative. tox screen + benzos. She was started on IV vanco, cefepime then changed to meropenem and acyclovir added. She has severe PCN allergy per family.  I do not think it likely she has meningitis (neck is supple) and think she probably had prolonged hypoglycemia and that is the cause of her encephalopathy. However given fevers would cover empirically until LP done tomorrow.  Recommendations Cont vanco Can continue meropenem as this would both Strep PNA, Neisseria Meninginitis and Listeria in a penicillin allergic patient. Cont Acyclovir as well pending HSV PCR on CSF.  Thank you very much for allowing me to participate in the care of this patient. Please call with questions.   Cheral Marker. Ola Spurr, MD

## 2018-01-30 ENCOUNTER — Inpatient Hospital Stay: Payer: Medicare Other

## 2018-01-30 LAB — GLUCOSE, CAPILLARY
GLUCOSE-CAPILLARY: 56 mg/dL — AB (ref 65–99)
GLUCOSE-CAPILLARY: 134 mg/dL — AB (ref 65–99)
GLUCOSE-CAPILLARY: 280 mg/dL — AB (ref 65–99)
GLUCOSE-CAPILLARY: 199 mg/dL — AB (ref 65–99)
Glucose-Capillary: 55 mg/dL — ABNORMAL LOW (ref 65–99)
Glucose-Capillary: 150 mg/dL — ABNORMAL HIGH (ref 65–99)
Glucose-Capillary: 130 mg/dL — ABNORMAL HIGH (ref 65–99)
Glucose-Capillary: 202 mg/dL — ABNORMAL HIGH (ref 65–99)

## 2018-01-30 LAB — CBC
HCT: 37.2 % (ref 35.0–47.0)
Hemoglobin: 12.2 g/dL (ref 12.0–16.0)
MCH: 28.7 pg (ref 26.0–34.0)
MCHC: 32.8 g/dL (ref 32.0–36.0)
MCV: 87.7 fL (ref 80.0–100.0)
Platelets: 329 10*3/uL (ref 150–440)
RBC: 4.24 MIL/uL (ref 3.80–5.20)
RDW: 15.4 % — AB (ref 11.5–14.5)
WBC: 17.6 10*3/uL — ABNORMAL HIGH (ref 3.6–11.0)

## 2018-01-30 LAB — BASIC METABOLIC PANEL
ANION GAP: 14 (ref 5–15)
BUN: 52 mg/dL — AB (ref 6–20)
CALCIUM: 8.1 mg/dL — AB (ref 8.9–10.3)
CO2: 26 mmol/L (ref 22–32)
Chloride: 98 mmol/L — ABNORMAL LOW (ref 101–111)
Creatinine, Ser: 1.43 mg/dL — ABNORMAL HIGH (ref 0.44–1.00)
GFR calc Af Amer: 40 mL/min — ABNORMAL LOW (ref >60)
GFR, EST NON AFRICAN AMERICAN: 35 mL/min — AB (ref >60)
GLUCOSE: 124 mg/dL — AB (ref 65–99)
Potassium: 3.2 mmol/L — ABNORMAL LOW (ref 3.5–5.1)
Sodium: 138 mmol/L (ref 135–145)

## 2018-01-30 LAB — CSF CELL COUNT WITH DIFFERENTIAL
EOS CSF: 0 %
LYMPHS CSF: 41 %
Monocyte-Macrophage-Spinal Fluid: 14 %
OTHER CELLS CSF: 0
RBC Count, CSF: 43 /mm3 — ABNORMAL HIGH (ref 0–3)
Segmented Neutrophils-CSF: 45 %
Tube #: 3
WBC, CSF: 5 /mm3 (ref 0–5)

## 2018-01-30 LAB — URINE CULTURE: Culture: <10000 — AB

## 2018-01-30 LAB — GLUCOSE, CSF: Glucose, CSF: 69 mg/dL (ref 40–70)

## 2018-01-30 LAB — PROCALCITONIN: PROCALCITONIN: 3.02 ng/mL

## 2018-01-30 LAB — PROTEIN, CSF: Total  Protein, CSF: 39 mg/dL (ref 15–45)

## 2018-01-30 MED ORDER — SODIUM CHLORIDE 0.9 % IV SOLN
2.0000 g | INTRAVENOUS | Status: DC
  Administered 2018-01-30 – 2018-02-01 (×3): 2 g via INTRAVENOUS
  Filled 2018-01-30: qty 2
  Filled 2018-01-30: qty 20
  Filled 2018-01-30 (×2): qty 2

## 2018-01-30 MED ORDER — LIDOCAINE HCL (PF) 1 % IJ SOLN
10.0000 mL | Freq: Once | INTRAMUSCULAR | Status: DC
  Filled 2018-01-30: qty 10

## 2018-01-30 MED ORDER — POTASSIUM CHLORIDE 10 MEQ/100ML IV SOLN
10.0000 meq | INTRAVENOUS | Status: AC
  Administered 2018-01-30 (×2): 10 meq via INTRAVENOUS
  Filled 2018-01-30 (×2): qty 100

## 2018-01-30 MED ORDER — DEXTROSE 50 % IV SOLN
INTRAVENOUS | Status: AC
  Administered 2018-01-30: 50 mL via INTRAVENOUS
  Filled 2018-01-30: qty 50

## 2018-01-30 MED ORDER — DEXTROSE 5 % IV SOLN
INTRAVENOUS | Status: DC
  Administered 2018-01-30: 06:00:00 via INTRAVENOUS

## 2018-01-30 MED ORDER — DEXTROSE 50 % IV SOLN
50.0000 mL | Freq: Once | INTRAVENOUS | Status: AC
  Administered 2018-01-30: 50 mL via INTRAVENOUS

## 2018-01-30 NOTE — Procedures (Signed)
ELECTROENCEPHALOGRAM REPORT   Patient: Sabrina Quinn       Room #: IC06A-AA EEG No. ID: [id number] Age: 77 y.o.        Sex: female Referring Physician: Conforti Report Date:  01/30/2018        Interpreting Physician: Alexis Goodell  History: [name] is an 77 y.o. female with altered mental status.  Found down and hypoglycemic  Medications:  Rocephin, Pepcid, Insulin, MVI, Diprivan  Conditions of Recording:  This is a 16 channel EEG carried out with the patient in the intubated and sedated state.  Description:  The background activity is not interpretable due to the predominance of muscle and movement artifact.  There are rare and brief occasions where some background activity is noted.  On these occasions the background rhythm is of low voltage, slow and poorly organized consisting of a polymorphic delta rhythm of 1-2 Hz.  No epileptiform activity is noted.   Hyperventilation and intermittent photic stimulation were not performed.   IMPRESSION: This is a technically difficult record due to the predominance of muscle and movement artifact.  No interpretations could be made concerning cerebral activity.     Alexis Goodell, MD Neurology [phone] 01/30/2018, 4:24 PM

## 2018-01-30 NOTE — Progress Notes (Signed)
eeg completed ° °

## 2018-01-30 NOTE — Progress Notes (Signed)
Subjective: Patient remains intubated.  LP this morning under fluoro.    Objective: Current vital signs: BP (!) 151/56   Pulse 85   Temp 98.8 F (37.1 C) (Bladder)   Resp 15   Ht '5\' 4"'  (1.626 m)   Wt 97.5 kg (214 lb 15.2 oz)   SpO2 100%   BMI 36.90 kg/m  Vital signs in last 24 hours: Temp:  [98.2 F (36.8 C)-100.3 F (37.9 C)] 98.8 F (37.1 C) (06/14 1200) Pulse Rate:  [76-101] 85 (06/14 1230) Resp:  [13-24] 15 (06/14 1230) BP: (93-174)/(45-108) 151/56 (06/14 1230) SpO2:  [96 %-100 %] 100 % (06/14 1230) FiO2 (%):  [30 %] 30 % (06/14 1200) Weight:  [97.5 kg (214 lb 15.2 oz)] 97.5 kg (214 lb 15.2 oz) (06/14 0500)  Intake/Output from previous day: 06/13 0701 - 06/14 0700 In: 2818.1 [I.V.:411.8; TF/TD:3220.2; IV Piggyback:886.9] Out: 1450 [Urine:1450] Intake/Output this shift: No intake/output data recorded. Nutritional status:  Diet Order    None      Neurologic Exam: Mental Status: Patient does not respond to verbal stimuli.  Does not respond to deep sternal rub.  Does not follow commands.  No verbalizations noted.  Cranial Nerves: II: patient does not respond confrontation bilaterally, pupils right 4 mm, left 4 mm,and reactive bilaterally III,IV,VI: doll's response absent bilaterally. Cold caloric responses absent bilaterally. V,VII: corneal reflex reduced bilaterally  VIII: patient does not respond to verbal stimuli IX,X: gag reflex unable to be tested, XI: trapezius strength unable to test bilaterally XII: tongue strength unable to test Motor: Extremities flaccid throughout.  Some spontaneous movement noted in the lower extremities.  No purposeful movements noted.  Continued LE increased tone. Sensory: Does not respond to noxious stimuli in any extremity. Deep Tendon Reflexes:  2+ throughout. Plantars: Mute bilaterally Cerebellar: Unable to perform    Lab Results: Basic Metabolic Panel: Recent Labs  Lab 01/28/18 1855 01/28/18 2319 01/29/18 0539  01/30/18 1021  NA 138  --  137 138  K 5.3*  --  3.6 3.2*  CL 94*  --  102 98*  CO2 30  --  17* 26  GLUCOSE 177*  --  164* 124*  BUN 36*  --  45* 52*  CREATININE 1.35* 1.40* 1.44* 1.43*  CALCIUM 8.5*  --  8.1* 8.1*  MG  --  2.2 2.4  --   PHOS  --  3.4 3.9  --     Liver Function Tests: Recent Labs  Lab 01/28/18 1855  AST 129*  ALT 96*  ALKPHOS 97  BILITOT 0.9  PROT 7.4  ALBUMIN 3.5   No results for input(s): LIPASE, AMYLASE in the last 168 hours. No results for input(s): AMMONIA in the last 168 hours.  CBC: Recent Labs  Lab 01/28/18 1855 01/28/18 2319 01/29/18 0539 01/30/18 1021  WBC 14.9* 23.7* 23.1* 17.6*  NEUTROABS 13.6*  --   --   --   HGB 14.9 14.6 13.2 12.2  HCT 45.9 46.2 41.8 37.2  MCV 88.3 87.9 88.2 87.7  PLT 472* 410 365 329    Cardiac Enzymes: Recent Labs  Lab 01/28/18 1855 01/29/18 0703  CKTOTAL  --  816*  TROPONINI <0.03  --     Lipid Panel: No results for input(s): CHOL, TRIG, HDL, CHOLHDL, VLDL, LDLCALC in the last 168 hours.  CBG: Recent Labs  Lab 01/30/18 0312 01/30/18 0314 01/30/18 0347 01/30/18 1023 01/30/18 1203  GLUCAP 56* 55* 150* 134* 130*    Microbiology: Results for orders placed  or performed during the hospital encounter of 01/28/18  CULTURE, BLOOD (ROUTINE X 2) w Reflex to ID Panel     Status: None (Preliminary result)   Collection Time: 01/28/18  9:30 PM  Result Value Ref Range Status   Specimen Description BLOOD LEFT ANTECUBITAL  Final   Special Requests   Final    BOTTLES DRAWN AEROBIC AND ANAEROBIC Blood Culture adequate volume   Culture   Final    NO GROWTH 2 DAYS Performed at [facility], [address] [place], [place] [address]    Report Status PENDING  Incomplete  CULTURE, BLOOD (ROUTINE X 2) w Reflex to ID Panel     Status: None (Preliminary result)   Collection Time: 01/28/18  9:45 PM  Result Value Ref Range Status   Specimen Description BLOOD BLOOD LEFT ARM  Final   Special Requests    Final    BOTTLES DRAWN AEROBIC AND ANAEROBIC Blood Culture adequate volume   Culture   Final    NO GROWTH 2 DAYS Performed at [facility], [address], [place], [place] [address]    Report Status PENDING  Incomplete  MRSA PCR Screening     Status: None   Collection Time: 01/28/18 11:19 PM  Result Value Ref Range Status   MRSA by PCR NEGATIVE NEGATIVE Final    Comment:        The GeneXpert MRSA Assay (FDA approved for NASAL specimens only), is one component of a comprehensive MRSA colonization surveillance program. It is not intended to diagnose MRSA infection nor to guide or monitor treatment for MRSA infections. Performed at [facility], [address] [place], [place] [address]   Urine Culture     Status: Abnormal   Collection Time: 01/29/18  1:06 AM  Result Value Ref Range Status   Specimen Description   Final    URINE, RANDOM Performed at [facility], [address] [place], [place] [address]    Special Requests   Final    NONE Performed at [facility], [address] [place], [place] [address]    Culture (A)  Final    <10,000 COLONIES/mL INSIGNIFICANT GROWTH Performed at [facility] [address], [place], [place] [address]    Report Status 01/30/2018 FINAL  Final  Culture, respiratory (NON-Expectorated)     Status: None (Preliminary result)   Collection Time: 01/29/18 11:44 AM  Result Value Ref Range Status   Specimen Description   Final    TRACHEAL ASPIRATE Performed at [facility], [address], [place], [place] [address]    Special Requests   Final    NONE Performed at [facility], [address] [place], [place] [address]    Gram Stain   Final    ABUNDANT WBC PRESENT,BOTH PMN AND MONONUCLEAR RARE GRAM POSITIVE COCCI    Culture   Final    NO GROWTH < 24 HOURS Performed at [facility], [address], [place], [place] [address]    Report Status PENDING   Incomplete  CSF culture     Status: None (Preliminary result)   Collection Time: 01/30/18  8:56 AM  Result Value Ref Range Status   Specimen Description CSF  Final   Special Requests NONE  Final   Gram Stain   Final    NO ORGANISMS SEEN FEW WBC SEEN MODERATE RED BLOOD CELLS Performed at [facility], [address] [place], [place] [address]  Culture PENDING  Incomplete   Report Status PENDING  Incomplete    Coagulation Studies: Recent Labs    01/28/18 1855  LABPROT 12.9  INR 0.98    Imaging: Dg Abd 1 View  Result Date: 01/28/2018 CLINICAL DATA:  OG tube placement. EXAM: ABDOMEN - 1 VIEW COMPARISON:  None. FINDINGS: Tip and side port of the enteric tube below the diaphragm in the stomach. No bowel dilatation in the upper abdomen. Multiple overlying monitoring devices project over the lower chest and upper abdomen. IMPRESSION: Tip and side port of the enteric tube below the diaphragm in the stomach. Electronically Signed   By: Jeb Levering M.D.   On: 01/28/2018 23:08   Ct Head Wo Contrast  Result Date: 01/28/2018 CLINICAL DATA:  77 year old female found unresponsive at home. EXAM: CT HEAD WITHOUT CONTRAST CT CERVICAL SPINE WITHOUT CONTRAST TECHNIQUE: Multidetector CT imaging of the head and cervical spine was performed following the standard protocol without intravenous contrast. Multiplanar CT image reconstructions of the cervical spine were also generated. COMPARISON:  None. FINDINGS: CT HEAD FINDINGS Brain: Patchy and confluent areas of decreased attenuation are noted throughout the deep and periventricular white matter of the cerebral hemispheres bilaterally, compatible with chronic microvascular ischemic disease. Physiologic calcifications are noted in the basal ganglia bilaterally. No evidence of acute infarction, hemorrhage, hydrocephalus, extra-axial collection or mass lesion/mass effect. Vascular: No hyperdense vessel or unexpected calcification. Skull: Normal.  Negative for fracture or focal lesion. Sinuses/Orbits: No acute finding. Other: Intubated patient. CT CERVICAL SPINE FINDINGS Alignment: Normal. Skull base and vertebrae: No acute fracture. No primary bone lesion or focal pathologic process. Soft tissues and spinal canal: No prevertebral fluid or swelling. No visible canal hematoma. 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 multilevel degenerative disc disease, most severe at C7-T1 and T1-T2. Mild multilevel facet arthropathy. Upper chest: Intubated patient. Other: None. IMPRESSION: 1. No acute intracranial abnormalities. 2. No signs of acute traumatic injury to the cervical spine. 3. Mild cerebral atrophy. Electronically Signed   By: Vinnie Langton M.D.   On: 01/28/2018 19:48   Ct Cervical Spine Wo Contrast  Result Date: 01/28/2018 CLINICAL DATA:  77 year old female found unresponsive at home. EXAM: CT HEAD WITHOUT CONTRAST CT CERVICAL SPINE WITHOUT CONTRAST TECHNIQUE: Multidetector CT imaging of the head and cervical spine was performed following the standard protocol without intravenous contrast. Multiplanar CT image reconstructions of the cervical spine were also generated. COMPARISON:  None. FINDINGS: CT HEAD FINDINGS Brain: Patchy and confluent areas of decreased attenuation are noted throughout the deep and periventricular white matter of the cerebral hemispheres bilaterally, compatible with chronic microvascular ischemic disease. Physiologic calcifications are noted in the basal ganglia bilaterally. No evidence of acute infarction, hemorrhage, hydrocephalus, extra-axial collection or mass lesion/mass effect. Vascular: No hyperdense vessel or unexpected calcification. Skull: Normal. Negative for fracture or focal lesion. Sinuses/Orbits: No acute finding. Other: Intubated patient. CT CERVICAL SPINE FINDINGS Alignment: Normal. Skull base and vertebrae: No acute fracture. No primary bone lesion or focal pathologic process. Soft tissues and  spinal canal: No prevertebral fluid or swelling. No visible canal hematoma. 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 multilevel degenerative disc disease, most severe at C7-T1 and T1-T2. Mild multilevel facet arthropathy. Upper chest: Intubated patient. Other: None. IMPRESSION: 1. No acute intracranial abnormalities. 2. No signs of acute traumatic injury to the cervical spine. 3. Mild cerebral atrophy. Electronically Signed   By: Vinnie Langton M.D.   On: 01/28/2018 19:48   Dg  Chest Port 1 View  Result Date: 01/30/2018 CLINICAL DATA:  Respiratory failure EXAM: PORTABLE CHEST 1 VIEW COMPARISON:  01/28/2018 FINDINGS: Cardiac shadow is stable. Endotracheal tube and nasogastric catheter are noted in satisfactory position. The lungs are clear bilaterally. No bony abnormality is noted. IMPRESSION: Tubes and lines as described above.  No acute abnormality noted. Electronically Signed   By: Inez Catalina M.D.   On: 01/30/2018 06:38   Dg Chest Portable 1 View  Result Date: 01/28/2018 CLINICAL DATA:  Acute respiratory failure.  Intubation. EXAM: PORTABLE CHEST 1 VIEW COMPARISON:  01/19/2018 FINDINGS: A new endotracheal tube is seen with tip approximately 5 cm above the carina. Both lungs are clear. Heart size is within normal limits. No evidence of pneumothorax or pleural effusion. IMPRESSION: Endotracheal tube in appropriate position.  No active lung disease. Electronically Signed   By: Earle Gell M.D.   On: 01/28/2018 19:20   Dg Fluoro Guided Loc Of Needle/cath Tip For Spinal Inject Lt  Result Date: 01/30/2018 CLINICAL DATA:  Fever and altered mental status.  Diabetes. EXAM: DIAGNOSTIC LUMBAR PUNCTURE UNDER FLUOROSCOPIC GUIDANCE FLUOROSCOPY TIME:  Fluoroscopy Time:  7 minutes 0 seconds. Radiation Exposure Index (if provided by the fluoroscopic device): Not applicable. Number of Acquired Spot Images: 1. PROCEDURE: Informed consent was obtained by the referring physician 01/29/2018 due to  patient's altered mental status prior to the procedure, including potential complications of headache, allergy, and pain. With the patient prone, the lower back was prepped with Betadine. 1% Lidocaine was used for local anesthesia. Lumbar puncture was performed at the L2-3 level using a 10 cm 22 gauge needle with return of clear CSF. Opening pressure was not obtained. 8 ml of CSF were obtained for laboratory studies split equally into four containers. The patient tolerated the procedure well and there were no apparent complications. IMPRESSION: Successful diagnostic lumbar puncture. Electronically Signed   By: Marin Olp M.D.   On: 01/30/2018 09:28    Medications:  I have reviewed the patient's current medications. Scheduled: . chlorhexidine gluconate (MEDLINE KIT)  15 mL Mouth Rinse BID  . famotidine  20 mg Oral BID  . free water  100 mL Per Tube Q8H  . insulin aspart  0-15 Units Subcutaneous Q4H  . lidocaine (PF)  10 mL Other Once  . mouth rinse  15 mL Mouth Rinse 10 times per day  . multivitamin  15 mL Per Tube Daily    Assessment/Plan: Patient remains intubated.  LP performed this AM under fluoro.  CSF shows 43 rbc's, 5 wbc's.  Protein and glucose are normal.  Doubt meningitis.  Patient hypoglycemic on presentation and has had some episodes of hypoglycemia since admission as well.  Unclear if there may be some hypoglycemic injury to brain.    Recommendations: 1.  MRI of the brain without contrast 2.  EEG pending 3.  May discontinue Acyclovir.     LOS: 2 days   Alexis Goodell, MD Neurology [phone] 01/30/2018  1:01 PM

## 2018-01-30 NOTE — Progress Notes (Signed)
Patient's tube feed stopped. Pt to be NPO after midnight for scheduled LP this AM.

## 2018-01-30 NOTE — Progress Notes (Signed)
Patient to IR for LP this morning and MRI this afternoon.  EEG completed at bedside late afternoon.    Foley removed for MRI and Purewick in place at this time.  Bladder scan at 1800 shows 225ml, will monitor closely.  Tube feeding restarted at noon with calculated PEPuP rate of 164ml/hr for remaining 12hrs.  PEPuP rate adjusted to 121ml/hr at 1500 after TF held for an hour while pt in MRI.  Goal rate of 60ml/hr will resume at midnight.  Patient afebrile throughout shift.  Rectal temp probe placed at 1800 to monitor fevers.  Patient's daughters updated with plan of care.     Report given to [facility], Therapist, sports.

## 2018-01-30 NOTE — Progress Notes (Signed)
Pt was transported on a transport vent to MRI and placed on the MRI vent. She was transported back to CCU.

## 2018-01-30 NOTE — Progress Notes (Signed)
[facility] PROGRESS NOTE Date of Admission:  01/28/2018     ID: NAMIRA ROSEKRANS is a 77 y.o. female with hypoglycemia, sepsis Principal Problem:   Sepsis ([place]) Active Problems:   Acute respiratory failure with hypoxia (HCC)   HTN (hypertension)   Asthma   Diabetes ([place])   Hyperkalemia   Altered mental status   Subjective: Remains intubated, sedated  ROS  Unable to obtain  Medications:  Antibiotics Given (last 72 hours)    Date/Time Action Medication Dose Rate   01/28/18 2036 New Bag/Given   ceFEPIme (MAXIPIME) 2 g in sodium chloride 0.9 % 100 mL IVPB 2 g 200 mL/hr   01/28/18 2048 New Bag/Given   vancomycin (VANCOCIN) IVPB 1000 mg/200 mL premix 1,000 mg 200 mL/hr   01/29/18 0431 New Bag/Given   vancomycin (VANCOCIN) 1,250 mg in sodium chloride 0.9 % 250 mL IVPB 1,250 mg 166.7 mL/hr   01/29/18 0815 New Bag/Given   ceFEPIme (MAXIPIME) 2 g in sodium chloride 0.9 % 100 mL IVPB 2 g 200 mL/hr   01/29/18 1000 New Bag/Given   cefTRIAXone (ROCEPHIN) 2 g in sodium chloride 0.9 % 100 mL IVPB 2 g 200 mL/hr   01/29/18 1137 New Bag/Given   acyclovir (ZOVIRAX) 715 mg in dextrose 5 % 150 mL IVPB 715 mg 164.3 mL/hr   01/29/18 1800 New Bag/Given   meropenem (MERREM) 2 g in sodium chloride 0.9 % 100 mL IVPB 2 g 200 mL/hr   01/29/18 2101 New Bag/Given   acyclovir (ZOVIRAX) 715 mg in dextrose 5 % 150 mL IVPB 715 mg 164.3 mL/hr   01/30/18 0405 New Bag/Given   vancomycin (VANCOCIN) 1,250 mg in sodium chloride 0.9 % 250 mL IVPB 1,250 mg 166.7 mL/hr   01/30/18 1025 New Bag/Given   meropenem (MERREM) 2 g in sodium chloride 0.9 % 100 mL IVPB 2 g 200 mL/hr   01/30/18 1125 New Bag/Given   acyclovir (ZOVIRAX) 715 mg in dextrose 5 % 150 mL IVPB 715 mg 164.3 mL/hr     . chlorhexidine gluconate (MEDLINE KIT)  15 mL Mouth Rinse BID  . famotidine  20 mg Oral BID  . free water  100 mL Per Tube Q8H  . insulin aspart  0-15 Units Subcutaneous Q4H  . lidocaine (PF)  10 mL Other Once   . mouth rinse  15 mL Mouth Rinse 10 times per day  . multivitamin  15 mL Per Tube Daily    Objective: Vital signs in last 24 hours: Temp:  [98.2 F (36.8 C)-100.3 F (37.9 C)] 98.4 F (36.9 C) (06/14 1530) Pulse Rate:  [76-104] 94 (06/14 1530) Resp:  [12-23] 16 (06/14 1530) BP: (93-175)/(45-94) 175/89 (06/14 1530) SpO2:  [96 %-100 %] 100 % (06/14 1530) FiO2 (%):  [30 %] 30 % (06/14 1457) Weight:  [97.5 kg (214 lb 15.2 oz)] 97.5 kg (214 lb 15.2 oz) (06/14 0500) Constitutional:  Intubated, sedated HENT: [place]/AT, PERRLA, no scleral icterus, OGT Mouth/Throat: ETT, OGT Cardiovascular: Normal rate, regular rhythm and normal heart sounds. Pulmonary/Chest: mech BS.  Neck = supple, no nuchal rigidity Abdominal: Soft. Bowel sounds are normal.  exhibits no distension. There is no tenderness.  Lymphadenopathy: no cervical adenopathy. No axillary adenopathy Neurological: intubated and sedated Skin: Skin is warm and dry. No rash noted. No erythema.  Ext 1+ edema Psychiatric: sedated  Lab Results Recent Labs    01/29/18 0539 01/30/18 1021  WBC 23.1* 17.6*  HGB 13.2 12.2  HCT 41.8 37.2  NA 137  138  K 3.6 3.2*  CL 102 98*  CO2 17* 26  BUN 45* 52*  CREATININE 1.44* 1.43*    Microbiology: Results for orders placed or performed during the hospital encounter of 01/28/18  CULTURE, BLOOD (ROUTINE X 2) w Reflex to ID Panel     Status: None (Preliminary result)   Collection Time: 01/28/18  9:30 PM  Result Value Ref Range Status   Specimen Description BLOOD LEFT ANTECUBITAL  Final   Special Requests   Final    BOTTLES DRAWN AEROBIC AND ANAEROBIC Blood Culture adequate volume   Culture   Final    NO GROWTH 2 DAYS Performed at [facility], [address] [place], [place] [address]    Report Status PENDING  Incomplete  CULTURE, BLOOD (ROUTINE X 2) w Reflex to ID Panel     Status: None (Preliminary result)   Collection Time: 01/28/18  9:45 PM  Result Value Ref Range Status    Specimen Description BLOOD BLOOD LEFT ARM  Final   Special Requests   Final    BOTTLES DRAWN AEROBIC AND ANAEROBIC Blood Culture adequate volume   Culture   Final    NO GROWTH 2 DAYS Performed at [facility], [address] [place], [place] [address]    Report Status PENDING  Incomplete  MRSA PCR Screening     Status: None   Collection Time: 01/28/18 11:19 PM  Result Value Ref Range Status   MRSA by PCR NEGATIVE NEGATIVE Final    Comment:        The GeneXpert MRSA Assay (FDA approved for NASAL specimens only), is one component of a comprehensive MRSA colonization surveillance program. It is not intended to diagnose MRSA infection nor to guide or monitor treatment for MRSA infections. Performed at [facility], [address] [place], [place] [address]   Urine Culture     Status: Abnormal   Collection Time: 01/29/18  1:06 AM  Result Value Ref Range Status   Specimen Description   Final    URINE, RANDOM Performed at [facility], [address], [place], [place] [address]    Special Requests   Final    NONE Performed at [facility], [address] [place], [place] [address]    Culture (A)  Final    <10,000 COLONIES/mL INSIGNIFICANT GROWTH Performed at [facility] [address] [place], [place] [address]    Report Status 01/30/2018 FINAL  Final  Culture, respiratory (NON-Expectorated)     Status: None (Preliminary result)   Collection Time: 01/29/18 11:44 AM  Result Value Ref Range Status   Specimen Description   Final    TRACHEAL ASPIRATE Performed at [facility], [address] [place], [place] [address]    Special Requests   Final    NONE Performed at [facility], [address] [place], [place] [address]    Gram Stain   Final    ABUNDANT WBC PRESENT,BOTH PMN AND MONONUCLEAR RARE GRAM POSITIVE COCCI    Culture   Final    NO GROWTH < 24 HOURS Performed at [facility],  [place] [address], [place], [place] [address]    Report Status PENDING  Incomplete  CSF culture     Status: None (Preliminary result)   Collection Time: 01/30/18  8:56 AM  Result Value Ref Range Status   Specimen Description CSF  Final   Special Requests NONE  Final   Gram Stain  Final    NO ORGANISMS SEEN FEW WBC SEEN MODERATE RED BLOOD CELLS Performed at [facility], [address] [place], [place] [address]    Culture PENDING  Incomplete   Report Status PENDING  Incomplete    Studies/Results: Dg Abd 1 View  Result Date: 01/28/2018 CLINICAL DATA:  OG tube placement. EXAM: ABDOMEN - 1 VIEW COMPARISON:  None. FINDINGS: Tip and side port of the enteric tube below the diaphragm in the stomach. No bowel dilatation in the upper abdomen. Multiple overlying monitoring devices project over the lower chest and upper abdomen. IMPRESSION: Tip and side port of the enteric tube below the diaphragm in the stomach. Electronically Signed   By: Jeb Levering M.D.   On: 01/28/2018 23:08   Ct Head Wo Contrast  Result Date: 01/28/2018 CLINICAL DATA:  77 year old female found unresponsive at home. EXAM: CT HEAD WITHOUT CONTRAST CT CERVICAL SPINE WITHOUT CONTRAST TECHNIQUE: Multidetector CT imaging of the head and cervical spine was performed following the standard protocol without intravenous contrast. Multiplanar CT image reconstructions of the cervical spine were also generated. COMPARISON:  None. FINDINGS: CT HEAD FINDINGS Brain: Patchy and confluent areas of decreased attenuation are noted throughout the deep and periventricular white matter of the cerebral hemispheres bilaterally, compatible with chronic microvascular ischemic disease. Physiologic calcifications are noted in the basal ganglia bilaterally. No evidence of acute infarction, hemorrhage, hydrocephalus, extra-axial collection or mass lesion/mass effect. Vascular: No hyperdense vessel or unexpected calcification. Skull: Normal. Negative  for fracture or focal lesion. Sinuses/Orbits: No acute finding. Other: Intubated patient. CT CERVICAL SPINE FINDINGS Alignment: Normal. Skull base and vertebrae: No acute fracture. No primary bone lesion or focal pathologic process. Soft tissues and spinal canal: No prevertebral fluid or swelling. No visible canal hematoma. 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 multilevel degenerative disc disease, most severe at C7-T1 and T1-T2. Mild multilevel facet arthropathy. Upper chest: Intubated patient. Other: None. IMPRESSION: 1. No acute intracranial abnormalities. 2. No signs of acute traumatic injury to the cervical spine. 3. Mild cerebral atrophy. Electronically Signed   By: Vinnie Langton M.D.   On: 01/28/2018 19:48   Ct Cervical Spine Wo Contrast  Result Date: 01/28/2018 CLINICAL DATA:  77 year old female found unresponsive at home. EXAM: CT HEAD WITHOUT CONTRAST CT CERVICAL SPINE WITHOUT CONTRAST TECHNIQUE: Multidetector CT imaging of the head and cervical spine was performed following the standard protocol without intravenous contrast. Multiplanar CT image reconstructions of the cervical spine were also generated. COMPARISON:  None. FINDINGS: CT HEAD FINDINGS Brain: Patchy and confluent areas of decreased attenuation are noted throughout the deep and periventricular white matter of the cerebral hemispheres bilaterally, compatible with chronic microvascular ischemic disease. Physiologic calcifications are noted in the basal ganglia bilaterally. No evidence of acute infarction, hemorrhage, hydrocephalus, extra-axial collection or mass lesion/mass effect. Vascular: No hyperdense vessel or unexpected calcification. Skull: Normal. Negative for fracture or focal lesion. Sinuses/Orbits: No acute finding. Other: Intubated patient. CT CERVICAL SPINE FINDINGS Alignment: Normal. Skull base and vertebrae: No acute fracture. No primary bone lesion or focal pathologic process. Soft tissues and spinal  canal: No prevertebral fluid or swelling. No visible canal hematoma. 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 multilevel degenerative disc disease, most severe at C7-T1 and T1-T2. Mild multilevel facet arthropathy. Upper chest: Intubated patient. Other: None. IMPRESSION: 1. No acute intracranial abnormalities. 2. No signs of acute traumatic injury to the cervical spine. 3. Mild cerebral atrophy. Electronically Signed   By: Quillian Quince  Entrikin M.D.   On: 01/28/2018 19:48   Mr Brain Wo Contrast  Result Date: 01/30/2018 CLINICAL DATA:  Altered level of consciousness. EXAM: MRI HEAD WITHOUT CONTRAST TECHNIQUE: Multiplanar, multiecho pulse sequences of the brain and surrounding structures were obtained without intravenous contrast. COMPARISON:  CT head 01/28/2018 FINDINGS: Brain: Mild cerebral atrophy, typical for age. Mild ventricular enlargement consistent with atrophy. Negative for infarct, hemorrhage, or mass. No fluid collection or midline shift. Vascular: Normal arterial flow voids Skull and upper cervical spine: Negative Sinuses/Orbits: Mild mucosal edema paranasal sinuses. Bilateral cataract surgery Other: None IMPRESSION: No acute abnormality. Mild atrophy, typical for age. No significant acute or chronic ischemia. Electronically Signed   By: Franchot Gallo M.D.   On: 01/30/2018 14:40   Dg Chest Port 1 View  Result Date: 01/30/2018 CLINICAL DATA:  Respiratory failure EXAM: PORTABLE CHEST 1 VIEW COMPARISON:  01/28/2018 FINDINGS: Cardiac shadow is stable. Endotracheal tube and nasogastric catheter are noted in satisfactory position. The lungs are clear bilaterally. No bony abnormality is noted. IMPRESSION: Tubes and lines as described above.  No acute abnormality noted. Electronically Signed   By: Inez Catalina M.D.   On: 01/30/2018 06:38   Dg Chest Portable 1 View  Result Date: 01/28/2018 CLINICAL DATA:  Acute respiratory failure.  Intubation. EXAM: PORTABLE CHEST 1 VIEW COMPARISON:   01/19/2018 FINDINGS: A new endotracheal tube is seen with tip approximately 5 cm above the carina. Both lungs are clear. Heart size is within normal limits. No evidence of pneumothorax or pleural effusion. IMPRESSION: Endotracheal tube in appropriate position.  No active lung disease. Electronically Signed   By: Earle Gell M.D.   On: 01/28/2018 19:20   Dg Fluoro Guided Loc Of Needle/cath Tip For Spinal Inject Lt  Result Date: 01/30/2018 CLINICAL DATA:  Fever and altered mental status.  Diabetes. EXAM: DIAGNOSTIC LUMBAR PUNCTURE UNDER FLUOROSCOPIC GUIDANCE FLUOROSCOPY TIME:  Fluoroscopy Time:  7 minutes 0 seconds. Radiation Exposure Index (if provided by the fluoroscopic device): Not applicable. Number of Acquired Spot Images: 1. PROCEDURE: Informed consent was obtained by the referring physician 01/29/2018 due to patient's altered mental status prior to the procedure, including potential complications of headache, allergy, and pain. With the patient prone, the lower back was prepped with Betadine. 1% Lidocaine was used for local anesthesia. Lumbar puncture was performed at the L2-3 level using a 10 cm 22 gauge needle with return of clear CSF. Opening pressure was not obtained. 8 ml of CSF were obtained for laboratory studies split equally into four containers. The patient tolerated the procedure well and there were no apparent complications. IMPRESSION: Successful diagnostic lumbar puncture. Electronically Signed   By: Marin Olp M.D.   On: 01/30/2018 09:28    Assessment/Plan: SUSANN LAWHORNE is a 77 y.o. female found unresponsive after last being seen approx 24 hours prior. She had recently started steroids for a bronchitis and her sugars had been elevated so she had been increasing her insulin dosing. She finished prednisone on Monday and on Tuesday sugars were going as low as 30s and also high.  She then apparently went to bed Tuesday night, her daughter visited her midday Wed and heard her snoring so  assumed she was napping, then found her later at 5 pm completely unresponsive.   Sugars in the 30s when found by EMS. She was treated and sugars improved but no improvement in MS. Intubated in ED.  Family denies any prodrome of fevers, chills, HA, neck pain, abd pain. She did have  the cough over the last week. No travel, no pets. Does not leave the house much. No known sick contacts On admit wbc was 14, temp 103. CXR negative. Ct head and neck negative. She had elevated lactic acid, mildly elevated CK, ARF, UA negative. tox screen + benzos. She was started on IV vanco, cefepime then changed to meropenem and acyclovir added. She has severe PCN allergy per family.  I do not think it likely she has meningitis (neck is supple) and think she probably had prolonged hypoglycemia and that is the cause of her encephalopathy. However given fevers would cover empirically until LP done tomorrow.  6/14- LP unimpressive, MRI negative. Remains intubated.  Recommendations Can narrow to ceftriaxone for sepsis. . Follow cultures EEG pending Thank you very much for the consult. Will follow with you.  Leonel Ramsay   01/30/2018, 4:03 PM

## 2018-01-30 NOTE — Progress Notes (Signed)
[facility] at [place] NAME: Sabrina Quinn    MR#:  [id number]  DATE OF BIRTH:  Jan 26, 1941  SUBJECTIVE:  CHIEF COMPLAINT:   Chief Complaint  Patient presents with  . Altered Mental Status  remains intubated, sedated, getting MRI and LP today REVIEW OF SYSTEMS:  Review of Systems  Unable to perform ROS: Intubated   DRUG ALLERGIES:   Allergies  Allergen Reactions  . Penicillins Rash   VITALS:  Blood pressure 129/66, pulse 82, temperature 99 F (37.2 C), temperature source Bladder, resp. rate 14, height 5\' 4"  (1.626 m), weight 97.5 kg (214 lb 15.2 oz), SpO2 100 %. PHYSICAL EXAMINATION:  Physical Exam  HENT:  Head: Normocephalic and atraumatic.  ETT in place  Eyes: Pupils are equal, round, and reactive to light. Conjunctivae and EOM are normal.  Neck: Normal range of motion. Neck supple. No tracheal deviation present. No thyromegaly present.  Cardiovascular: Normal rate, regular rhythm and normal heart sounds.  Pulmonary/Chest: Effort normal and breath sounds normal. No respiratory distress. She has no wheezes. She exhibits no tenderness.  Abdominal: Soft. Bowel sounds are normal. She exhibits no distension. There is no tenderness.  Musculoskeletal: Normal range of motion.  Neurological: No cranial nerve deficit.  Sedated on vent  Skin: Skin is warm and dry. No rash noted.  Psychiatric:  Unable to assess due to on vent   LABORATORY PANEL:  Female CBC Recent Labs  Lab 01/30/18 1021  WBC 17.6*  HGB 12.2  HCT 37.2  PLT 329   ------------------------------------------------------------------------------------------------------------------ Chemistries  Recent Labs  Lab 01/28/18 1855  01/29/18 0539 01/30/18 1021  NA 138  --  137 138  K 5.3*  --  3.6 3.2*  CL 94*  --  102 98*  CO2 30  --  17* 26  GLUCOSE 177*  --  164* 124*  BUN 36*  --  45* 52*  CREATININE 1.35*   < > 1.44* 1.43*  CALCIUM 8.5*  --  8.1* 8.1*  MG  --    <  > 2.4  --   AST 129*  --   --   --   ALT 96*  --   --   --   ALKPHOS 97  --   --   --   BILITOT 0.9  --   --   --    < > = values in this interval not displayed.   RADIOLOGY:  Mr Brain Wo Contrast  Result Date: 01/30/2018 CLINICAL DATA:  Altered level of consciousness. EXAM: MRI HEAD WITHOUT CONTRAST TECHNIQUE: Multiplanar, multiecho pulse sequences of the brain and surrounding structures were obtained without intravenous contrast. COMPARISON:  CT head 01/28/2018 FINDINGS: Brain: Mild cerebral atrophy, typical for age. Mild ventricular enlargement consistent with atrophy. Negative for infarct, hemorrhage, or mass. No fluid collection or midline shift. Vascular: Normal arterial flow voids Skull and upper cervical spine: Negative Sinuses/Orbits: Mild mucosal edema paranasal sinuses. Bilateral cataract surgery Other: None IMPRESSION: No acute abnormality. Mild atrophy, typical for age. No significant acute or chronic ischemia. Electronically Signed   By: Franchot Gallo M.D.   On: 01/30/2018 14:40   Dg Chest Port 1 View  Result Date: 01/30/2018 CLINICAL DATA:  Respiratory failure EXAM: PORTABLE CHEST 1 VIEW COMPARISON:  01/28/2018 FINDINGS: Cardiac shadow is stable. Endotracheal tube and nasogastric catheter are noted in satisfactory position. The lungs are clear bilaterally. No bony abnormality is noted. IMPRESSION: Tubes and lines as described above.  No acute abnormality noted. Electronically Signed   By: Inez Catalina M.D.   On: 01/30/2018 06:38   Dg Cyndy Freeze Guided Loc Of Needle/cath Tip For Spinal Inject Lt  Result Date: 01/30/2018 CLINICAL DATA:  Fever and altered mental status.  Diabetes. EXAM: DIAGNOSTIC LUMBAR PUNCTURE UNDER FLUOROSCOPIC GUIDANCE FLUOROSCOPY TIME:  Fluoroscopy Time:  7 minutes 0 seconds. Radiation Exposure Index (if provided by the fluoroscopic device): Not applicable. Number of Acquired Spot Images: 1. PROCEDURE: Informed consent was obtained by the referring physician  01/29/2018 due to patient's altered mental status prior to the procedure, including potential complications of headache, allergy, and pain. With the patient prone, the lower back was prepped with Betadine. 1% Lidocaine was used for local anesthesia. Lumbar puncture was performed at the L2-3 level using a 10 cm 22 gauge needle with return of clear CSF. Opening pressure was not obtained. 8 ml of CSF were obtained for laboratory studies split equally into four containers. The patient tolerated the procedure well and there were no apparent complications. IMPRESSION: Successful diagnostic lumbar puncture. Electronically Signed   By: Marin Olp M.D.   On: 01/30/2018 09:28   ASSESSMENT AND PLAN:   * Sepsis: present on admission. Continue empiric Abx. LP performed this am - CSF results not consistent with meningitis . Appreciate ID & Neuro input.  * Acute metabolic encephalopathy: could be due to hypoglycemia, unlikely meningitis per ID, Neuro - Unclear if there may be some hypoglycemic injury to brain.   - MRI of the brain without contrast & EEG per Neuro  * Acute respiratory failure with hypoxia ([place]) - remains on vent,mgmt per PCCM * HTN (hypertension) -stable, continue home meds * Diabetes ([place]) -sliding scale insulin with corresponding glucose checks   Poor prognosis    All the records are reviewed and case discussed with Care Management/Social Worker. Management plans discussed with the patient, nursing and they are in agreement.  CODE STATUS: Full Code  TOTAL TIME TAKING CARE OF THIS PATIENT: 15 minutes.   More than 50% of the time was spent in counseling/coordination of care: YES  POSSIBLE D/C IN 3-4 DAYS, DEPENDING ON CLINICAL CONDITION.   Max Sane M.D on 01/30/2018 at 3:31 PM  Between 7am to 6pm - Pager - [phone]  After 6pm go to [email or web address] - Proofreader  [facility]  Office  [phone]  CC: Primary care physician; Cletis Athens, MD  Note: This dictation was prepared with Dragon dictation along with smaller phrase technology. Any transcriptional errors that result from this process are unintentional.

## 2018-01-30 NOTE — Progress Notes (Signed)
Silver ring with 3 colored stones found to be possibly occluding circulation on left middle finger. Ring was removed and placed in a labeled specimen container inside a labeled zipped bag, then bag was placed on the patient's chart. Will advise day RN in the AM to give to daughter tomorrow.

## 2018-01-30 NOTE — Progress Notes (Signed)
PULMONARY / CRITICAL CARE MEDICINE   Name: Sabrina Quinn MRN: [id number] DOB: [birth year]    ADMISSION DATE:  01/28/2018  PT PROFILE: 77 F with history of DM, found by family unresponsive.  Severe hypoglycemia noted by EMS.  Cognition did not improve with dextrose infusion.  Intubated in ED for altered cognition.  High fever noted, diagnosis of severe sepsis of unknown origin given.  MAJOR EVENTS/TEST RESULTS: 6/12 admission as above 6/12 CT head and neck: No acute findings 6/13 minimally responsive off of all sedation.  High fevers persist.  Antibiotics changed to cover possible meningitis and HSV encephalitis.  Neurology consultation requested.  LP ordered but cannot be performed due to recent enoxaparin.  INDWELLING DEVICES:: ETT 06/13 >>   MICRO DATA: MRSA PCR 6/12 >> NEG Urine 6/12 >>  Resp 6/13 >>  Blood 6/12 >>   ANTIMICROBIALS:  Cefepime 6/12 >> 6/13 Vanc 6/12 >>  Meropenem 6/13 >>  Acyclovir 6/13 >>    SUBJECTIVE:  RASS -4, -5.  No spontaneous movement.  Tolerates SBT.  VITAL SIGNS: BP (!) 145/60   Pulse 81   Temp 99.6 F (37.6 C) (Bladder)   Resp 14   Ht 5\' 4"  (1.626 m)   Wt 214 lb 15.2 oz (97.5 kg)   SpO2 99%   BMI 36.90 kg/m   HEMODYNAMICS:    VENTILATOR SETTINGS: Vent Mode: PRVC FiO2 (%):  [30 %] 30 % Set Rate:  [14 bmp] 14 bmp Vt Set:  [450 mL] 450 mL PEEP:  [5 cmH20] 5 cmH20 Plateau Pressure:  [15 cmH20-22 cmH20] 15 cmH20  INTAKE / OUTPUT: I/O last 3 completed shifts: In: 3220 [I.V.:563.6; NG/GT:1519.4; IV Piggyback:1136.9] Out: 2650 [Urine:2650]  PHYSICAL EXAMINATION: General: Intubated, unresponsive off of all sedation Neuro: PERRL, EOMI, no withdrawal from pain, increased LE tone, DTRs symmetric HEENT: NCAT, sclerae white Cardiovascular: Regular, no M Lungs: Clear anteriorly without adventitious sounds Abdomen: Soft, NT, + BS Extremities: Warm, no edema Skin: No lesions noted  LABS:  BMET Recent Labs  Lab 01/28/18 1855  01/28/18 2319 01/29/18 0539  NA 138  --  137  K 5.3*  --  3.6  CL 94*  --  102  CO2 30  --  17*  BUN 36*  --  45*  CREATININE 1.35* 1.40* 1.44*  GLUCOSE 177*  --  164*    Electrolytes Recent Labs  Lab 01/28/18 1855 01/28/18 2319 01/29/18 0539  CALCIUM 8.5*  --  8.1*  MG  --  2.2 2.4  PHOS  --  3.4 3.9    CBC Recent Labs  Lab 01/28/18 1855 01/28/18 2319 01/29/18 0539  WBC 14.9* 23.7* 23.1*  HGB 14.9 14.6 13.2  HCT 45.9 46.2 41.8  PLT 472* 410 365    Coag's Recent Labs  Lab 01/28/18 1855  INR 0.98    Sepsis Markers Recent Labs  Lab 01/28/18 1904 01/28/18 2130 01/28/18 2319 01/29/18 0539  LATICACIDVEN 2.5* 2.5*  --  2.0*  PROCALCITON  --   --  1.98 3.35    ABG Recent Labs  Lab 01/28/18 1951 01/29/18 0449  PHART 7.50* 7.50*  PCO2ART 38 35  PO2ART 148* 108    Liver Enzymes Recent Labs  Lab 01/28/18 1855  AST 129*  ALT 96*  ALKPHOS 97  BILITOT 0.9  ALBUMIN 3.5    Cardiac Enzymes Recent Labs  Lab 01/28/18 1855  TROPONINI <0.03    Glucose Recent Labs  Lab 01/29/18 1637 01/29/18 1952 01/29/18 2347 01/30/18 0312 01/30/18  1657 01/30/18 0347  GLUCAP 242* 160* 89 56* 55* 150*    CXR: No acute findings    ASSESSMENT / PLAN:  PULMONARY A: Ventilator dependent respiratory failure, intubated for airway protection P:   Cont vent support - settings reviewed and/or adjusted Cont vent bundle Daily SBT if/when meets criteria  Cannot be extubated until mental status improves  CARDIOVASCULAR A:  No acute issues P:  Hemodynamic monitoring MAP goal >65 mmHg  RENAL A:   CKD - Cr appears to be at baseline Mild hyperkalemia, resolved Mild metabolic acidosis P:   Monitor BMET intermittently Monitor I/Os Correct electrolytes as indicated   GASTROINTESTINAL A:   Mild elevation in transaminases of unclear etiology P:   SUP: Enteral famotidine Continue TF protocol  HEMATOLOGIC A:   No acute issues P:  DVT px: SCDs  (enoxaparin held for LP planned 6/14) Monitor CBC intermittently Transfuse per usual guidelines   INFECTIOUS A:   Severe sepsis Leukocytosis Elevated PCT Concern for meningitis or encephalitis P:   Monitor temp, WBC count Micro and abx as above Infectious disease consultation requested LP requested and planned for 6/14  ENDOCRINE A:   DM 2 Severe hypoglycemia, resolved P:   Moderate scale SSI initiated 6/13  NEUROLOGIC A:   Coma ICU/ventilator associated discomfort P:   RASS goal: 0, -1 Minimize sedating medications Neurology consultation appreciated LP planned We will use propofol as needed for discomfort Daily WUA   FAMILY  - Updates: No family at bedside presently   CCM time: 30 mins The above time includes time spent in consultation with patient and/or family members and reviewing care plan on multidisciplinary rounds.  This time also includes discussion regarding scheduling of LP with interventional radiology, discussion with neurology and infectious disease consultants  Hermelinda Dellen, DO   01/30/2018, 8:10 AM    Patient ID: Sabrina Quinn, female   DOB: 1941-07-19, 77 y.o.   MRN: [id number]

## 2018-01-30 NOTE — Progress Notes (Signed)
Patient's FSBS down to 56/55 on recheck. Dextrose IV given per protocol. FSBS up to 150 after 15 minutes. Hinton Dyer, NP notfiied. New order for D5 at 62ml.hr while patient is NPO for scheduled LP this AM.

## 2018-01-30 NOTE — Progress Notes (Signed)
Spoke with [facility] concerning decrease urine output via external catheter. Order obtained to insert foley if patient has greater than 360ml on next bladder scan.

## 2018-01-30 NOTE — Progress Notes (Signed)
Bladder scan shows 319 ml.

## 2018-01-31 LAB — BASIC METABOLIC PANEL
ANION GAP: 12 (ref 5–15)
Anion gap: 13 (ref 5–15)
BUN: 60 mg/dL — ABNORMAL HIGH (ref 6–20)
BUN: 53 mg/dL — ABNORMAL HIGH (ref 6–20)
CHLORIDE: 98 mmol/L — AB (ref 101–111)
CHLORIDE: 100 mmol/L — AB (ref 101–111)
CO2: 28 mmol/L (ref 22–32)
CO2: 28 mmol/L (ref 22–32)
CREATININE: 1.28 mg/dL — AB (ref 0.44–1.00)
Calcium: 7.9 mg/dL — ABNORMAL LOW (ref 8.9–10.3)
Calcium: 8.4 mg/dL — ABNORMAL LOW (ref 8.9–10.3)
Creatinine, Ser: 1.25 mg/dL — ABNORMAL HIGH (ref 0.44–1.00)
GFR calc Af Amer: 46 mL/min — ABNORMAL LOW (ref >60)
GFR calc Af Amer: 48 mL/min — ABNORMAL LOW (ref >60)
GFR calc non Af Amer: 40 mL/min — ABNORMAL LOW (ref >60)
GFR calc non Af Amer: 41 mL/min — ABNORMAL LOW (ref >60)
GLUCOSE: 189 mg/dL — AB (ref 65–99)
Glucose, Bld: 201 mg/dL — ABNORMAL HIGH (ref 65–99)
POTASSIUM: 3.1 mmol/L — AB (ref 3.5–5.1)
Potassium: 3.4 mmol/L — ABNORMAL LOW (ref 3.5–5.1)
Sodium: 138 mmol/L (ref 135–145)
Sodium: 141 mmol/L (ref 135–145)

## 2018-01-31 LAB — GLUCOSE, CAPILLARY
GLUCOSE-CAPILLARY: 156 mg/dL — AB (ref 65–99)
GLUCOSE-CAPILLARY: 182 mg/dL — AB (ref 65–99)
Glucose-Capillary: 204 mg/dL — ABNORMAL HIGH (ref 65–99)
Glucose-Capillary: 206 mg/dL — ABNORMAL HIGH (ref 65–99)
Glucose-Capillary: 214 mg/dL — ABNORMAL HIGH (ref 65–99)
Glucose-Capillary: 256 mg/dL — ABNORMAL HIGH (ref 65–99)

## 2018-01-31 LAB — CK: CK TOTAL: 571 U/L — AB (ref 38–234)

## 2018-01-31 LAB — CBC
HEMATOCRIT: 36.8 % (ref 35.0–47.0)
HEMOGLOBIN: 11.9 g/dL — AB (ref 12.0–16.0)
MCH: 28.4 pg (ref 26.0–34.0)
MCHC: 32.3 g/dL (ref 32.0–36.0)
MCV: 87.9 fL (ref 80.0–100.0)
Platelets: 295 10*3/uL (ref 150–440)
RBC: 4.19 MIL/uL (ref 3.80–5.20)
RDW: 15.5 % — ABNORMAL HIGH (ref 11.5–14.5)
WBC: 17.2 10*3/uL — ABNORMAL HIGH (ref 3.6–11.0)

## 2018-01-31 LAB — TRIGLYCERIDES: TRIGLYCERIDES: 77 mg/dL (ref <150)

## 2018-01-31 LAB — PHOSPHORUS: PHOSPHORUS: 3.6 mg/dL (ref 2.5–4.6)

## 2018-01-31 LAB — TSH: TSH: 6.737 u[IU]/mL — AB (ref 0.350–4.500)

## 2018-01-31 LAB — MAGNESIUM: Magnesium: 2.4 mg/dL (ref 1.7–2.4)

## 2018-01-31 MED ORDER — DEXMEDETOMIDINE HCL IN NACL 400 MCG/100ML IV SOLN
INTRAVENOUS | Status: AC
Start: 2018-01-31 — End: 2018-02-01
  Filled 2018-01-31: qty 100

## 2018-01-31 MED ORDER — POTASSIUM CHLORIDE 20 MEQ PO PACK
60.0000 meq | PACK | ORAL | Status: AC
  Administered 2018-02-01 (×2): 60 meq via ORAL
  Filled 2018-01-31 (×2): qty 3

## 2018-01-31 NOTE — Plan of Care (Signed)
Pt unable to follow commands. Propofol reduced this shift, currently infusing at 15 mcg/kg/hr. Tube feed reduced to 55 ml/hr per order at midnight per PEPuP titration, tolerating. Abdomen soft, with faint bowel sounds. Pt had no urine output at 2100, bladder scan results showed 319 ml. Order obtained to insert foley cath if >300. Foley catheter inserted without difficulty with 350 urine immediately drained into foley bag. Pt did void incontinent prior to insertion also. Pt continues to have a low grade temperature and remains on the cooling blanket.

## 2018-01-31 NOTE — Progress Notes (Signed)
Subjective: Patient remains intubated.  Sedation discontinued today.    Objective: Current vital signs: BP (!) 146/69   Pulse 89   Temp 98.5 F (36.9 C) (Axillary)   Resp 15   Ht _0  (1.626 m)   Wt 97.9 kg (215 lb 13.3 oz)   SpO2 98%   BMI 37.05 kg/m  Vital signs in last 24 hours: Temp:  [98.3 F (36.8 C)-100.3 F (37.9 C)] 98.5 F (36.9 C) (06/15 1200) Pulse Rate:  [76-104] 89 (06/15 1200) Resp:  [12-21] 15 (06/15 1200) BP: (103-191)/(42-109) 146/69 (06/15 1200) SpO2:  [98 %-100 %] 98 % (06/15 1200) FiO2 (%):  [24 %-30 %] 24 % (06/15 0759) Weight:  [97.9 kg (215 lb 13.3 oz)] 97.9 kg (215 lb 13.3 oz) (06/15 0404)  Intake/Output from previous day: 06/14 0701 - 06/15 0700 In: 3677.8 [I.V.:1091.8; NG/GT:2008; IV Piggyback:578] Out: 4270 [Urine:1650] Intake/Output this shift: No intake/output data recorded. Nutritional status:  Diet Order    None      Neurologic Exam: Mental Status: Patient does not respond to verbal stimuli.  Does not respond to deep sternal rub.  Does not follow commands.  No verbalizations noted.  Cranial Nerves: II: patient does not respond confrontation bilaterally, pupils right 4 mm, left 4 mm,and reactive bilaterally III,IV,VI: doll's response present bilaterally.  V,VII: corneal reflex present bilaterally  VIII: patient does not respond to verbal stimuli IX,X: gag reflex unable to be tested, XI: trapezius strength unable to test bilaterally XII: tongue strength unable to test Motor: Extremities flaccid throughout.  No purposeful movements noted. Sensory: Withdraws to noxious stimuli in all extremities. Plantars: upgoing bilaterally Cerebellar: Unable to perform    Lab Results: Basic Metabolic Panel: Recent Labs  Lab 01/28/18 1855 01/28/18 2319 01/29/18 0539 01/30/18 1021 01/31/18 0600  NA 138  --  137 138 138  K 5.3*  --  3.6 3.2* 3.4*  CL 94*  --  102 98* 98*  CO2 30  --  17* 26 28  GLUCOSE 177*  --  164* 124* 201*  BUN  36*  --  45* 52* 60*  CREATININE 1.35* 1.40* 1.44* 1.43* 1.28*  CALCIUM 8.5*  --  8.1* 8.1* 7.9*  MG  --  2.2 2.4  --  2.4  PHOS  --  3.4 3.9  --  3.6    Liver Function Tests: Recent Labs  Lab 01/28/18 1855  AST 129*  ALT 96*  ALKPHOS 97  BILITOT 0.9  PROT 7.4  ALBUMIN 3.5   No results for input(s): LIPASE, AMYLASE in the last 168 hours. No results for input(s): AMMONIA in the last 168 hours.  CBC: Recent Labs  Lab 01/28/18 1855 01/28/18 2319 01/29/18 0539 01/30/18 1021 01/31/18 0600  WBC 14.9* 23.7* 23.1* 17.6* 17.2*  NEUTROABS 13.6*  --   --   --   --   HGB 14.9 14.6 13.2 12.2 11.9*  HCT 45.9 46.2 41.8 37.2 36.8  MCV 88.3 87.9 88.2 87.7 87.9  PLT 472* 410 365 329 295    Cardiac Enzymes: Recent Labs  Lab 01/28/18 1855 01/29/18 0703 01/31/18 0600  CKTOTAL  --  816* 571*  TROPONINI <0.03  --   --     Lipid Panel: No results for input(s): CHOL, TRIG, HDL, CHOLHDL, VLDL, LDLCALC in the last 168 hours.  CBG: Recent Labs  Lab 01/30/18 1923 01/30/18 2326 01/31/18 0321 01/31/18 0720 01/31/18 1149  GLUCAP 280* 199* 204* 206* 156*    Microbiology: Results for orders  placed or performed during the hospital encounter of 01/28/18  CULTURE, BLOOD (ROUTINE X 2) w Reflex to ID Panel     Status: None (Preliminary result)   Collection Time: 01/28/18  9:30 PM  Result Value Ref Range Status   Specimen Description BLOOD LEFT ANTECUBITAL  Final   Special Requests   Final    BOTTLES DRAWN AEROBIC AND ANAEROBIC Blood Culture adequate volume   Culture   Final    NO GROWTH 3 DAYS Performed at [facility], [address], [place], [place] [address]    Report Status PENDING  Incomplete  CULTURE, BLOOD (ROUTINE X 2) w Reflex to ID Panel     Status: None (Preliminary result)   Collection Time: 01/28/18  9:45 PM  Result Value Ref Range Status   Specimen Description BLOOD BLOOD LEFT ARM  Final   Special Requests   Final    BOTTLES DRAWN AEROBIC AND ANAEROBIC  Blood Culture adequate volume   Culture   Final    NO GROWTH 3 DAYS Performed at [facility], [address] [place], [place] [address]    Report Status PENDING  Incomplete  MRSA PCR Screening     Status: None   Collection Time: 01/28/18 11:19 PM  Result Value Ref Range Status   MRSA by PCR NEGATIVE NEGATIVE Final    Comment:        The GeneXpert MRSA Assay (FDA approved for NASAL specimens only), is one component of a comprehensive MRSA colonization surveillance program. It is not intended to diagnose MRSA infection nor to guide or monitor treatment for MRSA infections. Performed at [facility], [address], [place], [place] [address]   Urine Culture     Status: Abnormal   Collection Time: 01/29/18  1:06 AM  Result Value Ref Range Status   Specimen Description   Final    URINE, RANDOM Performed at [facility], [address] [place], [place] [address]    Special Requests   Final    NONE Performed at [facility], [address] [place], [place] [address]    Culture (A)  Final    <10,000 COLONIES/mL INSIGNIFICANT GROWTH Performed at [facility] [address] [place], [place] [address]    Report Status 01/30/2018 FINAL  Final  Culture, respiratory (NON-Expectorated)     Status: None (Preliminary result)   Collection Time: 01/29/18 11:44 AM  Result Value Ref Range Status   Specimen Description   Final    TRACHEAL ASPIRATE Performed at [facility], [address], [place], [place] [address]    Special Requests   Final    NONE Performed at [facility], [address] [place], [place] [address]    Gram Stain   Final    ABUNDANT WBC PRESENT,BOTH PMN AND MONONUCLEAR RARE GRAM POSITIVE COCCI    Culture   Final    CULTURE REINCUBATED FOR BETTER GROWTH Performed at [facility], [place] [address] [place], [place] [address]    Report Status PENDING  Incomplete  CSF culture     Status:  None (Preliminary result)   Collection Time: 01/30/18  8:56 AM  Result Value Ref Range Status   Specimen Description   Final    CSF Performed at [facility], [address], [place], [place] [address]    Special Requests   Final    NONE Performed at [facility], [place], [place]  [address]    Gram Stain   Final    NO ORGANISMS SEEN FEW WBC SEEN MODERATE RED BLOOD CELLS Performed at [facility], [address], [place], [place] [address]    Culture   Final    NO GROWTH < 24 HOURS Performed at [facility], [place] [address] [place], [place] [address]    Report Status PENDING  Incomplete    Coagulation Studies: Recent Labs    01/28/18 1855  LABPROT 12.9  INR 0.98    Imaging: Mr Brain Wo Contrast  Result Date: 01/30/2018 CLINICAL DATA:  Altered level of consciousness. EXAM: MRI HEAD WITHOUT CONTRAST TECHNIQUE: Multiplanar, multiecho pulse sequences of the brain and surrounding structures were obtained without intravenous contrast. COMPARISON:  CT head 01/28/2018 FINDINGS: Brain: Mild cerebral atrophy, typical for age. Mild ventricular enlargement consistent with atrophy. Negative for infarct, hemorrhage, or mass. No fluid collection or midline shift. Vascular: Normal arterial flow voids Skull and upper cervical spine: Negative Sinuses/Orbits: Mild mucosal edema paranasal sinuses. Bilateral cataract surgery Other: None IMPRESSION: No acute abnormality. Mild atrophy, typical for age. No significant acute or chronic ischemia. Electronically Signed   By: Franchot Gallo M.D.   On: 01/30/2018 14:40   Dg Chest Port 1 View  Result Date: 01/30/2018 CLINICAL DATA:  Respiratory failure EXAM: PORTABLE CHEST 1 VIEW COMPARISON:  01/28/2018 FINDINGS: Cardiac shadow is stable. Endotracheal tube and nasogastric catheter are noted in satisfactory position. The lungs are clear bilaterally. No bony abnormality is noted. IMPRESSION: Tubes and lines  as described above.  No acute abnormality noted. Electronically Signed   By: Inez Catalina M.D.   On: 01/30/2018 06:38   Dg Cyndy Freeze Guided Loc Of Needle/cath Tip For Spinal Inject Lt  Result Date: 01/30/2018 CLINICAL DATA:  Fever and altered mental status.  Diabetes. EXAM: DIAGNOSTIC LUMBAR PUNCTURE UNDER FLUOROSCOPIC GUIDANCE FLUOROSCOPY TIME:  Fluoroscopy Time:  7 minutes 0 seconds. Radiation Exposure Index (if provided by the fluoroscopic device): Not applicable. Number of Acquired Spot Images: 1. PROCEDURE: Informed consent was obtained by the referring physician 01/29/2018 due to patient's altered mental status prior to the procedure, including potential complications of headache, allergy, and pain. With the patient prone, the lower back was prepped with Betadine. 1% Lidocaine was used for local anesthesia. Lumbar puncture was performed at the L2-3 level using a 10 cm 22 gauge needle with return of clear CSF. Opening pressure was not obtained. 8 ml of CSF were obtained for laboratory studies split equally into four containers. The patient tolerated the procedure well and there were no apparent complications. IMPRESSION: Successful diagnostic lumbar puncture. Electronically Signed   By: Marin Olp M.D.   On: 01/30/2018 09:28    Medications:  I have reviewed the patient's current medications. Scheduled: . chlorhexidine gluconate (MEDLINE KIT)  15 mL Mouth Rinse BID  . famotidine  20 mg Oral BID  . free water  100 mL Per Tube Q8H  . insulin aspart  0-15 Units Subcutaneous Q4H  . lidocaine (PF)  10 mL Other Once  . mouth rinse  15 mL Mouth Rinse 10 times per day  . multivitamin  15 mL Per Tube Daily    Assessment/Plan: Patient remains poorly responsive despite discontinuation of sedation.  MRI of the brain reviewed and unremarkable.  EEG nondiagnostic.  LP shows no evidence of meningitis.  Patient with some evidence of renal and hepatic injury on lab work therefore it may take a prolonged  period to clear sedation.  Would continue to follow.     LOS: 3 days   Alexis Goodell, MD Neurology [phone] 01/31/2018  12:34 PM

## 2018-01-31 NOTE — Plan of Care (Signed)
Silver ring with 3 colored stones found to be possibly occluding circulation on left middle finger. Ring was removed and placed in a labeled specimen container inside a labeled zipped bag, then bag was placed on the patient's chart. Will advise day RN in the AM to give to daughter tomorrow.

## 2018-01-31 NOTE — Progress Notes (Signed)
Patient ID: Sabrina Quinn, female   DOB: 02/27/1941, 77 y.o.   MRN: [id number]  PULMONARY / CRITICAL CARE MEDICINE   Name: Sabrina Quinn MRN: [id number] DOB: November 30, 1940    ADMISSION DATE:  01/28/2018  PT PROFILE: 77 F with history of DM, found by family unresponsive.  Severe hypoglycemia noted by EMS.  Cognition did not improve with dextrose infusion.  Intubated in ED for altered cognition.  High fever noted, diagnosis of severe sepsis of unknown origin given.  MAJOR EVENTS/TEST RESULTS: 6/12 admission as above 6/12 CT head and neck: No acute findings 6/13 minimally responsive off of all sedation.  High fevers persist.  Antibiotics changed to cover possible meningitis and HSV encephalitis.  Neurology consultation requested.  LP ordered but cannot be performed due to recent enoxaparin.  INDWELLING DEVICES:: ETT 06/13 >>   MICRO DATA: MRSA PCR 6/12 >> NEG Urine 6/12 >>  Resp 6/13 >>  Blood 6/12 >>   ANTIMICROBIALS:  Cefepime 6/12 >> 6/13 Vanc 6/12 >>  Meropenem 6/13 >>  Acyclovir 6/13 >>    SUBJECTIVE:  Patient is status post lumbar puncture and MRI of the brain  VITAL SIGNS: BP 136/67   Pulse 90   Temp 99.5 F (37.5 C) (Rectal)   Resp 17   Ht 5\' 4"  (1.626 m)   Wt 215 lb 13.3 oz (97.9 kg)   SpO2 100%   BMI 37.05 kg/m   HEMODYNAMICS:    VENTILATOR SETTINGS: Vent Mode: Spontaneous FiO2 (%):  [24 %-30 %] 24 % Set Rate:  [14 bmp] 14 bmp Vt Set:  [450 mL] 450 mL PEEP:  [5 cmH20] 5 cmH20 Pressure Support:  [5 cmH20] 5 cmH20 Plateau Pressure:  [10 cmH20-17 cmH20] 17 cmH20  INTAKE / OUTPUT: I/O last 3 completed shifts: In: 4763.5 [I.V.:1333.2; NG/GT:2438; IV Piggyback:992.3] Out: 2390 [Urine:2390]  PHYSICAL EXAMINATION: General: Intubated, unresponsive off of all sedation Neuro: PERRL, EOMI, no withdrawal from pain, increased LE tone, DTRs symmetric HEENT: NCAT, sclerae white Cardiovascular: Regular, no M Lungs: Clear anteriorly without adventitious  sounds Abdomen: Soft, NT, + BS Extremities: Warm, no edema Skin: No lesions noted  LABS:  BMET Recent Labs  Lab 01/29/18 0539 01/30/18 1021 01/31/18 0600  NA 137 138 138  K 3.6 3.2* 3.4*  CL 102 98* 98*  CO2 17* 26 28  BUN 45* 52* 60*  CREATININE 1.44* 1.43* 1.28*  GLUCOSE 164* 124* 201*    Electrolytes Recent Labs  Lab 01/28/18 2319 01/29/18 0539 01/30/18 1021 01/31/18 0600  CALCIUM  --  8.1* 8.1* 7.9*  MG 2.2 2.4  --  2.4  PHOS 3.4 3.9  --  3.6    CBC Recent Labs  Lab 01/29/18 0539 01/30/18 1021 01/31/18 0600  WBC 23.1* 17.6* 17.2*  HGB 13.2 12.2 11.9*  HCT 41.8 37.2 36.8  PLT 365 329 295    Coag's Recent Labs  Lab 01/28/18 1855  INR 0.98    Sepsis Markers Recent Labs  Lab 01/28/18 1904 01/28/18 2130 01/28/18 2319 01/29/18 0539 01/30/18 1021  LATICACIDVEN 2.5* 2.5*  --  2.0*  --   PROCALCITON  --   --  1.98 3.35 3.02    ABG Recent Labs  Lab 01/28/18 1951 01/29/18 0449  PHART 7.50* 7.50*  PCO2ART 38 35  PO2ART 148* 108    Liver Enzymes Recent Labs  Lab 01/28/18 1855  AST 129*  ALT 96*  ALKPHOS 97  BILITOT 0.9  ALBUMIN 3.5    Cardiac Enzymes Recent Labs  Lab 01/28/18 1855  TROPONINI <0.03    Glucose Recent Labs  Lab 01/30/18 1203 01/30/18 1713 01/30/18 1923 01/30/18 2326 01/31/18 0321 01/31/18 0720  GLUCAP 130* 202* 280* 199* 204* 206*    CXR: No acute findings  ASSESSMENT / PLAN:  Ventilator dependent respiratory failure, intubated for airway protection  Renal insufficiency. Creatinine is down to 1.28 predominantly prerenal numbers will follow  Leukocytosis. Appreciate Dr. Ola Spurr consultation, will de-escalate to Rocephin. Lumbar puncture and MRI negative  Encephalopathy. May be secondary to hypoglycemic episode, pending results of EEG, appreciate neurology's input  Hypokalemia we'll replace  Critical care time 35 minutes  Hermelinda Dellen, DO   01/31/2018, 8:33 AM    Patient ID: Sabrina Quinn, female   DOB: [birth year], 77 y.o.   MRN: [id number]

## 2018-01-31 NOTE — Progress Notes (Signed)
[place] [facility] [place] NAME: Sabrina Quinn    MR#:  [id number]  DATE OF BIRTH:  08-05-1941  SUBJECTIVE:  CHIEF COMPLAINT:   Chief Complaint  Patient presents with  . Altered Mental Status   Patient continues to be on ventilatory support.  Off sedation and not awake.  REVIEW OF SYSTEMS:  Review of Systems  Unable to perform ROS: Intubated   DRUG ALLERGIES:   Allergies  Allergen Reactions  . Penicillins Rash   VITALS:  Blood pressure 136/67, pulse 90, temperature 99.5 F (37.5 C), temperature source Rectal, resp. rate 17, height 5\' 4"  (1.626 m), weight 97.9 kg (215 lb 13.3 oz), SpO2 100 %. PHYSICAL EXAMINATION:  Physical Exam  HENT:  Head: Normocephalic and atraumatic.  ETT in place  Eyes: Pupils are equal, round, and reactive to light. Conjunctivae and EOM are normal.  Neck: Normal range of motion. Neck supple. No tracheal deviation present. No thyromegaly present.  Cardiovascular: Normal rate, regular rhythm and normal heart sounds.  Pulmonary/Chest: Effort normal and breath sounds normal. No respiratory distress. She has no wheezes. She exhibits no tenderness.  Abdominal: Soft. Bowel sounds are normal. She exhibits no distension. There is no tenderness.  Musculoskeletal: Normal range of motion.  Neurological: No cranial nerve deficit.  Sedated on vent  Skin: Skin is warm and dry. No rash noted.  Psychiatric:  Unable to assess due to on vent   LABORATORY PANEL:  Female CBC Recent Labs  Lab 01/31/18 0600  WBC 17.2*  HGB 11.9*  HCT 36.8  PLT 295   ------------------------------------------------------------------------------------------------------------------ Chemistries  Recent Labs  Lab 01/28/18 1855  01/31/18 0600  NA 138   < > 138  K 5.3*   < > 3.4*  CL 94*   < > 98*  CO2 30   < > 28  GLUCOSE 177*   < > 201*  BUN 36*   < > 60*  CREATININE 1.35*   < > 1.28*  CALCIUM 8.5*   < > 7.9*  MG  --    < > 2.4  AST  129*  --   --   ALT 96*  --   --   ALKPHOS 97  --   --   BILITOT 0.9  --   --    < > = values in this interval not displayed.   RADIOLOGY:  Mr Brain Wo Contrast  Result Date: 01/30/2018 CLINICAL DATA:  Altered level of consciousness. EXAM: MRI HEAD WITHOUT CONTRAST TECHNIQUE: Multiplanar, multiecho pulse sequences of the brain and surrounding structures were obtained without intravenous contrast. COMPARISON:  CT head 01/28/2018 FINDINGS: Brain: Mild cerebral atrophy, typical for age. Mild ventricular enlargement consistent with atrophy. Negative for infarct, hemorrhage, or mass. No fluid collection or midline shift. Vascular: Normal arterial flow voids Skull and upper cervical spine: Negative Sinuses/Orbits: Mild mucosal edema paranasal sinuses. Bilateral cataract surgery Other: None IMPRESSION: No acute abnormality. Mild atrophy, typical for age. No significant acute or chronic ischemia. Electronically Signed   By: Franchot Gallo M.D.   On: 01/30/2018 14:40   ASSESSMENT AND PLAN:   * Sepsis: present on admission. Continue empiric Abx. LP performed this am - CSF results not consistent with meningitis .  Appreciate ID & Neurology input.  * Acute metabolic encephalopathy: could be due to hypoglycemia, unlikely meningitis per ID, Neuro - Unclear if there may be some hypoglycemic injury to brain.   - MRI of the brain -nothing acute -  EEG was inconclusive  * Acute respiratory failure with hypoxia (HCC) - remains on vent,mgmt per PCCM  * HTN (hypertension) -stable, continue home meds  * Diabetes ([place]) -sliding scale insulin with corresponding glucose checks  Poor prognosis   All the records are reviewed and case discussed with Care Management/Social Worker. Management plans discussed with the patient, nursing and they are in agreement.  CODE STATUS: Full Code  TOTAL TIME TAKING CARE OF THIS PATIENT: 25 minutes.   POSSIBLE D/C IN 3-4 DAYS, DEPENDING ON CLINICAL CONDITION.   Neita Carp M.D on 01/31/2018 at 11:42 AM  Between 7am to 6pm - Pager - [phone]  After 6pm go to [email or web address] - Proofreader  [facility]  Office  [phone]  CC: Primary care physician; Cletis Athens, MD  Note: This dictation was prepared with Dragon dictation along with smaller phrase technology. Any transcriptional errors that result from this process are unintentional.

## 2018-02-01 ENCOUNTER — Inpatient Hospital Stay: Payer: Medicare Other

## 2018-02-01 LAB — BASIC METABOLIC PANEL
Anion gap: 10 (ref 5–15)
BUN: 51 mg/dL — AB (ref 6–20)
CALCIUM: 8.3 mg/dL — AB (ref 8.9–10.3)
CO2: 26 mmol/L (ref 22–32)
Chloride: 105 mmol/L (ref 101–111)
Creatinine, Ser: 1.23 mg/dL — ABNORMAL HIGH (ref 0.44–1.00)
GFR, EST AFRICAN AMERICAN: 48 mL/min — AB (ref >60)
GFR, EST NON AFRICAN AMERICAN: 42 mL/min — AB (ref >60)
Glucose, Bld: 169 mg/dL — ABNORMAL HIGH (ref 65–99)
Potassium: 5 mmol/L (ref 3.5–5.1)
SODIUM: 141 mmol/L (ref 135–145)

## 2018-02-01 LAB — C DIFFICILE QUICK SCREEN W PCR REFLEX
C Diff antigen: NEGATIVE
C Diff interpretation: NOT DETECTED
C Diff toxin: NEGATIVE

## 2018-02-01 LAB — MAGNESIUM: MAGNESIUM: 2.4 mg/dL (ref 1.7–2.4)

## 2018-02-01 LAB — GLUCOSE, CAPILLARY
GLUCOSE-CAPILLARY: 166 mg/dL — AB (ref 65–99)
GLUCOSE-CAPILLARY: 198 mg/dL — AB (ref 65–99)
GLUCOSE-CAPILLARY: 224 mg/dL — AB (ref 65–99)
GLUCOSE-CAPILLARY: 181 mg/dL — AB (ref 65–99)
Glucose-Capillary: 167 mg/dL — ABNORMAL HIGH (ref 65–99)
Glucose-Capillary: 220 mg/dL — ABNORMAL HIGH (ref 65–99)

## 2018-02-01 LAB — CBC
HCT: 37.3 % (ref 35.0–47.0)
Hemoglobin: 12 g/dL (ref 12.0–16.0)
MCH: 28.6 pg (ref 26.0–34.0)
MCHC: 32.3 g/dL (ref 32.0–36.0)
MCV: 88.4 fL (ref 80.0–100.0)
PLATELETS: 350 10*3/uL (ref 150–440)
RBC: 4.22 MIL/uL (ref 3.80–5.20)
RDW: 15.9 % — AB (ref 11.5–14.5)
WBC: 18.1 10*3/uL — ABNORMAL HIGH (ref 3.6–11.0)

## 2018-02-01 LAB — CULTURE, RESPIRATORY: CULTURE: NORMAL

## 2018-02-01 LAB — CULTURE, RESPIRATORY W GRAM STAIN

## 2018-02-01 LAB — HERPES SIMPLEX VIRUS(HSV) DNA BY PCR
HSV 1 DNA: NEGATIVE
HSV 2 DNA: NEGATIVE

## 2018-02-01 LAB — PHOSPHORUS: PHOSPHORUS: 3.3 mg/dL (ref 2.5–4.6)

## 2018-02-01 MED ORDER — VALPROATE SODIUM 500 MG/5ML IV SOLN
500.0000 mg | Freq: Two times a day (BID) | INTRAVENOUS | Status: DC
  Administered 2018-02-01 – 2018-02-02 (×2): 500 mg via INTRAVENOUS
  Filled 2018-02-01 (×3): qty 5

## 2018-02-01 MED ORDER — FENTANYL CITRATE (PF) 100 MCG/2ML IJ SOLN
100.0000 ug | Freq: Once | INTRAMUSCULAR | Status: AC
  Administered 2018-02-01: 100 ug via INTRAVENOUS
  Filled 2018-02-01: qty 2

## 2018-02-01 MED ORDER — VALPROATE SODIUM 500 MG/5ML IV SOLN
1000.0000 mg | Freq: Once | INTRAVENOUS | Status: AC
  Administered 2018-02-01: 1000 mg via INTRAVENOUS
  Filled 2018-02-01: qty 10

## 2018-02-01 NOTE — Progress Notes (Signed)
Patient ID: Sabrina Quinn, female   DOB: July 09, 1941, 77 y.o.   MRN: [id number]  PULMONARY / CRITICAL CARE MEDICINE   Name: DAVINIA RICCARDI MRN: [id number] DOB: 1940-11-09    ADMISSION DATE:  01/28/2018  PT PROFILE: 74 F with history of DM, found by family unresponsive.  Severe hypoglycemia noted by EMS.  Cognition did not improve with dextrose infusion.  Intubated in ED for altered cognition.  High fever noted, diagnosis of severe sepsis of unknown origin given.  MAJOR EVENTS/TEST RESULTS: 6/12 admission as above 6/12 CT head and neck: No acute findings 6/13 minimally responsive off of all sedation.  High fevers persist.  Antibiotics changed to cover possible meningitis and HSV encephalitis.  Neurology consultation requested.  LP ordered but cannot be performed due to recent enoxaparin.  INDWELLING DEVICES:: ETT 06/13 >>   MICRO DATA: MRSA PCR 6/12 >> NEG Urine 6/12 >>  Resp 6/13 >>  Blood 6/12 >>   ANTIMICROBIALS:  Cefepime 6/12 >> 6/13 Vanc 6/12 >>  Meropenem 6/13 >>  Acyclovir 6/13 >>    SUBJECTIVE:  Patient is status post lumbar puncture and MRI of the brain  VITAL SIGNS: BP (!) 155/81   Pulse 93   Temp 99.1 F (37.3 C) (Rectal)   Resp 17   Ht 5\' 4"  (1.626 m)   Wt 213 lb 10 oz (96.9 kg)   SpO2 99%   BMI 36.67 kg/m   HEMODYNAMICS:    VENTILATOR SETTINGS: Vent Mode: Spontaneous FiO2 (%):  [24 %] 24 % PEEP:  [5 cmH20] 5 cmH20 Pressure Support:  [5 cmH20] 5 cmH20  INTAKE / OUTPUT: I/O last 3 completed shifts: In: 3683.4 [I.V.:534.4; Other:45; UU/VO:5366; IV Piggyback:200] Out: 2775 [Urine:2775]  PHYSICAL EXAMINATION: General: Intubated, unresponsive off of all sedation Neuro: PERRL, EOMI, no withdrawal from pain, increased LE tone, DTRs symmetric HEENT: NCAT, sclerae white Cardiovascular: Regular, no M Lungs: Clear anteriorly without adventitious sounds Abdomen: Soft, NT, + BS Extremities: Warm, no edema Skin: No lesions noted  LABS:  BMET Recent  Labs  Lab 01/31/18 0600 01/31/18 2159 02/01/18 0517  NA 138 141 141  K 3.4* 3.1* 5.0  CL 98* 100* 105  CO2 28 28 26   BUN 60* 53* 51*  CREATININE 1.28* 1.25* 1.23*  GLUCOSE 201* 189* 169*    Electrolytes Recent Labs  Lab 01/29/18 0539  01/31/18 0600 01/31/18 2159 02/01/18 0517  CALCIUM 8.1*   < > 7.9* 8.4* 8.3*  MG 2.4  --  2.4  --  2.4  PHOS 3.9  --  3.6  --  3.3   < > = values in this interval not displayed.    CBC Recent Labs  Lab 01/30/18 1021 01/31/18 0600 02/01/18 0517  WBC 17.6* 17.2* 18.1*  HGB 12.2 11.9* 12.0  HCT 37.2 36.8 37.3  PLT 329 295 350    Coag's Recent Labs  Lab 01/28/18 1855  INR 0.98    Sepsis Markers Recent Labs  Lab 01/28/18 1904 01/28/18 2130 01/28/18 2319 01/29/18 0539 01/30/18 1021  LATICACIDVEN 2.5* 2.5*  --  2.0*  --   PROCALCITON  --   --  1.98 3.35 3.02    ABG Recent Labs  Lab 01/28/18 1951 01/29/18 0449  PHART 7.50* 7.50*  PCO2ART 38 35  PO2ART 148* 108    Liver Enzymes Recent Labs  Lab 01/28/18 1855  AST 129*  ALT 96*  ALKPHOS 97  BILITOT 0.9  ALBUMIN 3.5    Cardiac Enzymes Recent Labs  Lab 01/28/18 1855  TROPONINI <0.03    Glucose Recent Labs  Lab 01/31/18 1552 01/31/18 1922 01/31/18 2321 02/01/18 0423 02/01/18 0519 02/01/18 0729  GLUCAP 214* 256* 182* 167* 166* 198*    CXR: No acute findings  ASSESSMENT / PLAN:  Ventilator dependent respiratory failure, intubated for airway protection  Renal insufficiency. Creatinine is down to 1.23 predominantly prerenal numbers will follow  Leukocytosis. Appreciate Dr. Ola Spurr consultation, on Rocephin empirically. Lumbar puncture and MRI negative  Encephalopathy. May be secondary to hypoglycemic episode, pending results of EEG, appreciate neurology's input  Critical care time 40 minutes  Hermelinda Dellen, DO   02/01/2018, 8:02 AM    Patient ID: Sherrine Maples, female   DOB: [birth year], 77 y.o.   MRN: [id number] Patient ID: LOETA HERST,  female   DOB: [birth year], 77 y.o.   MRN: [id number]

## 2018-02-01 NOTE — Progress Notes (Signed)
Maggie, NP notified of patient having multiple loose bowel movements this shift. Orders obtained for rectal tube and  to check for cdiff.

## 2018-02-01 NOTE — Progress Notes (Signed)
Family in to visit patient.  Updated by Physician.  Pt continues to go ridged with stimulation.  Does not follow commands.  No corneal reflex noted at this time.

## 2018-02-01 NOTE — Progress Notes (Signed)
Pt repositioned and appears to be attempting to wiggle toes on command. Does not appear to be following other commands.

## 2018-02-01 NOTE — Progress Notes (Signed)
[place] [facility] [place] NAME: Sabrina Quinn    MR#:  [id number]  DATE OF BIRTH:  1940/12/26  SUBJECTIVE:  CHIEF COMPLAINT:   Chief Complaint  Patient presents with  . Altered Mental Status   Continues on full ventilatory support.  Off sedation for 24 hours with no change in mental status.  REVIEW OF SYSTEMS:  Review of Systems  Unable to perform ROS: Intubated   DRUG ALLERGIES:   Allergies  Allergen Reactions  . Penicillins Rash   VITALS:  Blood pressure (!) 155/81, pulse 93, temperature 99.1 F (37.3 C), temperature source Rectal, resp. rate 17, height 5\' 4"  (1.626 m), weight 96.9 kg (213 lb 10 oz), SpO2 98 %. PHYSICAL EXAMINATION:  Physical Exam  HENT:  Head: Normocephalic and atraumatic.  ETT in place  Eyes: Pupils are equal, round, and reactive to light. Conjunctivae and EOM are normal.  Neck: Normal range of motion. Neck supple. No tracheal deviation present. No thyromegaly present.  Cardiovascular: Normal rate, regular rhythm and normal heart sounds.  Pulmonary/Chest: Effort normal and breath sounds normal. No respiratory distress. She has no wheezes. She exhibits no tenderness.  Abdominal: Soft. Bowel sounds are normal. She exhibits no distension. There is no tenderness.  Musculoskeletal: Normal range of motion.  Neurological: No cranial nerve deficit.  Sedated on vent  Skin: Skin is warm and dry. No rash noted.  Psychiatric:  Unable to assess due to on vent   LABORATORY PANEL:  Female CBC Recent Labs  Lab 02/01/18 0517  WBC 18.1*  HGB 12.0  HCT 37.3  PLT 350   ------------------------------------------------------------------------------------------------------------------ Chemistries  Recent Labs  Lab 01/28/18 1855  02/01/18 0517  NA 138   < > 141  K 5.3*   < > 5.0  CL 94*   < > 105  CO2 30   < > 26  GLUCOSE 177*   < > 169*  BUN 36*   < > 51*  CREATININE 1.35*   < > 1.23*  CALCIUM 8.5*   < > 8.3*  MG   --    < > 2.4  AST 129*  --   --   ALT 96*  --   --   ALKPHOS 97  --   --   BILITOT 0.9  --   --    < > = values in this interval not displayed.   RADIOLOGY:  Dg Chest Port 1 View  Result Date: 02/01/2018 CLINICAL DATA:  Patient poor historian at this time. Encounter for patient on mechanically assisted ventilation. EXAM: PORTABLE CHEST - 1 VIEW COMPARISON:  01/30/2018 FINDINGS: Endotracheal tube and nasogastric tube stable in position. Lungs are clear. Heart size upper limits normal for technique. No pneumothorax No effusion. Visualized bones unremarkable. IMPRESSION: No acute cardiopulmonary disease. Support hardware stable in position. Electronically Signed   By: Lucrezia Europe M.D.   On: 02/01/2018 10:21   ASSESSMENT AND PLAN:   * Sepsis: present on admission. Continue empiric Abx. LP performed this am - CSF results not consistent with meningitis .  On IV ceftriaxone  * Acute metabolic encephalopathy: could be due to hypoglycemia, unlikely meningitis per ID, Neuro - Unclear if there may be some hypoglycemic injury to brain.   - MRI of the brain -nothing acute -EEG was inconclusive  * Acute respiratory failure with hypoxia (HCC) - remains on vent mgmt per PCCM  * HTN (hypertension) -stable, continue home meds  * Diabetes ([facility]) -sliding  scale insulin with corresponding glucose checks  Poor prognosis .  Consider palliative care consult if no improvement by tomorrow  All the records are reviewed and case discussed with Care Management/Social Worker. Management plans discussed with the patient, nursing and they are in agreement.  CODE STATUS: Full Code  TOTAL TIME TAKING CARE OF THIS PATIENT: 25 minutes.   POSSIBLE D/C IN 1-2 DAYS, DEPENDING ON CLINICAL CONDITION.   Neita Carp M.D on 02/01/2018 at 11:24 AM  Between 7am to 6pm - Pager - [phone]  After 6pm go to [email or web address] - Proofreader  [facility]  Office  [phone]  CC:  Primary care physician; Cletis Athens, MD  Note: This dictation was prepared with Dragon dictation along with smaller phrase technology. Any transcriptional errors that result from this process are unintentional.

## 2018-02-01 NOTE — Progress Notes (Signed)
Notified RT that pt has pushed bite block out and is biting tongue and tube, unable to be reinserted.

## 2018-02-01 NOTE — Progress Notes (Signed)
IV Team consult ordered for PIV. Patient currently only has 1 PIV.

## 2018-02-01 NOTE — Progress Notes (Signed)
Subjective: Patient remains unresponsive.  Has remained off sedation.    Objective: Current vital signs: BP (!) 155/81   Pulse 93   Temp 99.1 F (37.3 C) (Rectal)   Resp 17   Ht '5\' 4"'  (1.626 m)   Wt 96.9 kg (213 lb 10 oz)   SpO2 98%   BMI 36.67 kg/m  Vital signs in last 24 hours: Temp:  [98.5 F (36.9 C)-99.3 F (37.4 C)] 99.1 F (37.3 C) (06/16 0030) Pulse Rate:  [79-98] 93 (06/16 0700) Resp:  [13-26] 17 (06/16 0700) BP: (126-176)/(52-104) 155/81 (06/16 0700) SpO2:  [97 %-100 %] 98 % (06/16 0824) FiO2 (%):  [24 %] 24 % (06/16 0824) Weight:  [96.9 kg (213 lb 10 oz)-97.9 kg (215 lb 13.3 oz)] 96.9 kg (213 lb 10 oz) (06/16 0403)  Intake/Output from previous day: 06/15 0701 - 06/16 0700 In: 1905 [I.V.:240; NG/GT:1520; IV Piggyback:100] Out: 1725 [Urine:1725] Intake/Output this shift: No intake/output data recorded. Nutritional status:  Diet Order    None      Neurologic Exam: Mental Status: Patient does not respond to verbal stimuli.  Does not respond to deep sternal rub.  Does not follow commands.  No verbalizations noted.  Cranial Nerves: II: patient does not respond confrontation bilaterally, pupils right 4 mm, left 4 mm,and reactive bilaterally III,IV,VI: doll's response present bilaterally.  V,VII: corneal reflex present bilaterally  VIII: patient does not respond to verbal stimuli IX,X: gag reflex reduced, XI: trapezius strength unable to test bilaterally XII: tongue strength unable to test Motor: Increased tone noted in the LUE and BLE's.  No spontaneous movement noted.  No purposeful movements noted. Plantars: upgoing bilaterally Cerebellar: Unable to perform    Lab Results: Basic Metabolic Panel: Recent Labs  Lab 01/28/18 2319 01/29/18 0539 01/30/18 1021 01/31/18 0600 01/31/18 2159 02/01/18 0517  NA  --  137 138 138 141 141  K  --  3.6 3.2* 3.4* 3.1* 5.0  CL  --  102 98* 98* 100* 105  CO2  --  17* '26 28 28 26  ' GLUCOSE  --  164* 124* 201*  189* 169*  BUN  --  45* 52* 60* 53* 51*  CREATININE 1.40* 1.44* 1.43* 1.28* 1.25* 1.23*  CALCIUM  --  8.1* 8.1* 7.9* 8.4* 8.3*  MG 2.2 2.4  --  2.4  --  2.4  PHOS 3.4 3.9  --  3.6  --  3.3    Liver Function Tests: Recent Labs  Lab 01/28/18 1855  AST 129*  ALT 96*  ALKPHOS 97  BILITOT 0.9  PROT 7.4  ALBUMIN 3.5   No results for input(s): LIPASE, AMYLASE in the last 168 hours. No results for input(s): AMMONIA in the last 168 hours.  CBC: Recent Labs  Lab 01/28/18 1855 01/28/18 2319 01/29/18 0539 01/30/18 1021 01/31/18 0600 02/01/18 0517  WBC 14.9* 23.7* 23.1* 17.6* 17.2* 18.1*  NEUTROABS 13.6*  --   --   --   --   --   HGB 14.9 14.6 13.2 12.2 11.9* 12.0  HCT 45.9 46.2 41.8 37.2 36.8 37.3  MCV 88.3 87.9 88.2 87.7 87.9 88.4  PLT 472* 410 365 329 295 350    Cardiac Enzymes: Recent Labs  Lab 01/28/18 1855 01/29/18 0703 01/31/18 0600  CKTOTAL  --  816* 571*  TROPONINI <0.03  --   --     Lipid Panel: Recent Labs  Lab 01/31/18 0608  TRIG 77    CBG: Recent Labs  Lab 01/31/18 1922  01/31/18 2321 02/01/18 0423 02/01/18 0519 02/01/18 0729  GLUCAP 256* 182* 167* 166* 198*    Microbiology: Results for orders placed or performed during the hospital encounter of 01/28/18  CULTURE, BLOOD (ROUTINE X 2) w Reflex to ID Panel     Status: None (Preliminary result)   Collection Time: 01/28/18  9:30 PM  Result Value Ref Range Status   Specimen Description BLOOD LEFT ANTECUBITAL  Final   Special Requests   Final    BOTTLES DRAWN AEROBIC AND ANAEROBIC Blood Culture adequate volume   Culture   Final    NO GROWTH 3 DAYS Performed at [facility], [address] [place], [place] [address]    Report Status PENDING  Incomplete  CULTURE, BLOOD (ROUTINE X 2) w Reflex to ID Panel     Status: None (Preliminary result)   Collection Time: 01/28/18  9:45 PM  Result Value Ref Range Status   Specimen Description BLOOD BLOOD LEFT ARM  Final   Special Requests    Final    BOTTLES DRAWN AEROBIC AND ANAEROBIC Blood Culture adequate volume   Culture   Final    NO GROWTH 3 DAYS Performed at [facility], [address], [place], [place] [address]    Report Status PENDING  Incomplete  MRSA PCR Screening     Status: None   Collection Time: 01/28/18 11:19 PM  Result Value Ref Range Status   MRSA by PCR NEGATIVE NEGATIVE Final    Comment:        The GeneXpert MRSA Assay (FDA approved for NASAL specimens only), is one component of a comprehensive MRSA colonization surveillance program. It is not intended to diagnose MRSA infection nor to guide or monitor treatment for MRSA infections. Performed at [facility], [address] [place], [place] [address]   Urine Culture     Status: Abnormal   Collection Time: 01/29/18  1:06 AM  Result Value Ref Range Status   Specimen Description   Final    URINE, RANDOM Performed at [facility], [address] [place], [place] [address]    Special Requests   Final    NONE Performed at [facility], [address] [place], [place] [address]    Culture (A)  Final    <10,000 COLONIES/mL INSIGNIFICANT GROWTH Performed at [place] [address], [place], [place] [address]    Report Status 01/30/2018 FINAL  Final  Culture, respiratory (NON-Expectorated)     Status: None (Preliminary result)   Collection Time: 01/29/18 11:44 AM  Result Value Ref Range Status   Specimen Description   Final    TRACHEAL ASPIRATE Performed at [facility], [address] [place], [place] [address]    Special Requests   Final    NONE Performed at [facility], [address] [place], [place] [address]    Gram Stain   Final    ABUNDANT WBC PRESENT,BOTH PMN AND MONONUCLEAR RARE GRAM POSITIVE COCCI    Culture   Final    CULTURE REINCUBATED FOR BETTER GROWTH Performed at [facility], [place] [address] [place], [place] [address]    Report Status  PENDING  Incomplete  CSF culture     Status: None (Preliminary result)   Collection Time: 01/30/18  8:56 AM  Result Value Ref Range Status   Specimen Description   Final    CSF Performed at [facility], [address] [place], [place] [address]  Special Requests   Final    NONE Performed at [facility], [address] [place], [place] [address]    Gram Stain   Final    NO ORGANISMS SEEN FEW WBC SEEN MODERATE RED BLOOD CELLS Performed at [facility], [address] [place], [place] [address]    Culture   Final    NO GROWTH < 24 HOURS Performed at [facility], [place] [address] [place], [place] [address]    Report Status PENDING  Incomplete  C difficile quick scan w PCR reflex     Status: None   Collection Time: 02/01/18  4:09 AM  Result Value Ref Range Status   C Diff antigen NEGATIVE NEGATIVE Final   C Diff toxin NEGATIVE NEGATIVE Final   C Diff interpretation No C. difficile detected.  Final    Comment: Performed at [facility], [address] [place], [place] [address]    Coagulation Studies: No results for input(s): LABPROT, INR in the last 72 hours.  Imaging: Mr Brain Wo Contrast  Result Date: 01/30/2018 CLINICAL DATA:  Altered level of consciousness. EXAM: MRI HEAD WITHOUT CONTRAST TECHNIQUE: Multiplanar, multiecho pulse sequences of the brain and surrounding structures were obtained without intravenous contrast. COMPARISON:  CT head 01/28/2018 FINDINGS: Brain: Mild cerebral atrophy, typical for age. Mild ventricular enlargement consistent with atrophy. Negative for infarct, hemorrhage, or mass. No fluid collection or midline shift. Vascular: Normal arterial flow voids Skull and upper cervical spine: Negative Sinuses/Orbits: Mild mucosal edema paranasal sinuses. Bilateral cataract surgery Other: None IMPRESSION: No acute abnormality. Mild atrophy, typical for age. No significant acute or chronic ischemia. Electronically  Signed   By: Franchot Gallo M.D.   On: 01/30/2018 14:40   Dg Fluoro Guided Loc Of Needle/cath Tip For Spinal Inject Lt  Result Date: 01/30/2018 CLINICAL DATA:  Fever and altered mental status.  Diabetes. EXAM: DIAGNOSTIC LUMBAR PUNCTURE UNDER FLUOROSCOPIC GUIDANCE FLUOROSCOPY TIME:  Fluoroscopy Time:  7 minutes 0 seconds. Radiation Exposure Index (if provided by the fluoroscopic device): Not applicable. Number of Acquired Spot Images: 1. PROCEDURE: Informed consent was obtained by the referring physician 01/29/2018 due to patient's altered mental status prior to the procedure, including potential complications of headache, allergy, and pain. With the patient prone, the lower back was prepped with Betadine. 1% Lidocaine was used for local anesthesia. Lumbar puncture was performed at the L2-3 level using a 10 cm 22 gauge needle with return of clear CSF. Opening pressure was not obtained. 8 ml of CSF were obtained for laboratory studies split equally into four containers. The patient tolerated the procedure well and there were no apparent complications. IMPRESSION: Successful diagnostic lumbar puncture. Electronically Signed   By: Marin Olp M.D.   On: 01/30/2018 09:28    Medications:  I have reviewed the patient's current medications. Scheduled: . chlorhexidine gluconate (MEDLINE KIT)  15 mL Mouth Rinse BID  . famotidine  20 mg Oral BID  . free water  100 mL Per Tube Q8H  . insulin aspart  0-15 Units Subcutaneous Q4H  . lidocaine (PF)  10 mL Other Once  . mouth rinse  15 mL Mouth Rinse 10 times per day  . multivitamin  15 mL Per Tube Daily    Assessment/Plan: Patient remains unresponsive off sedation.  Continues to have increased tone and extensor plantars.  Etiology unclear with unremarkable imaging and LP.  Can not rule out seizure since EEG suboptimal.    Recommendations: 1.  Would load  Depakote at 1066m IV now, with maintenance of 5035mIV q 12 hours.  Would titrate to therapeutic level.    2.  Depakote level and hepatic function panel in AM. 3.  Will continue to follow with you.  Case discussed with Dr. CoJefferson Fuel LOS: 4 days   LeAlexis GoodellMD Neurology [id number]/16/2019  8:42 AM

## 2018-02-01 NOTE — Progress Notes (Signed)
Report received from [facility].  Pt found resting in bed, on vent with pressure support.  Pt responding only to pain.  Remains off sedation.

## 2018-02-01 NOTE — Progress Notes (Addendum)
Notified Maggie, NP that pt has pushed bite block out and is biting tongue and tube. Fentanyl PRN dose given with no effect. New orders received for Fentanyl 160mcg IV once, if that is not effective Versed IV 2mg  once.

## 2018-02-01 NOTE — Progress Notes (Signed)
Cristi Jenkins given patients Silver metal colored Ring with three colored stones.  It was removed from patient by Previous nurse, and handed to family by this RN.

## 2018-02-02 ENCOUNTER — Inpatient Hospital Stay: Payer: Medicare Other

## 2018-02-02 ENCOUNTER — Inpatient Hospital Stay: Payer: Self-pay

## 2018-02-02 DIAGNOSIS — J9601 Acute respiratory failure with hypoxia: Secondary | ICD-10-CM

## 2018-02-02 DIAGNOSIS — J9600 Acute respiratory failure, unspecified whether with hypoxia or hypercapnia: Secondary | ICD-10-CM

## 2018-02-02 LAB — CBC
HEMATOCRIT: 35.6 % (ref 35.0–47.0)
Hemoglobin: 11.6 g/dL — ABNORMAL LOW (ref 12.0–16.0)
MCH: 29.2 pg (ref 26.0–34.0)
MCHC: 32.5 g/dL (ref 32.0–36.0)
MCV: 89.6 fL (ref 80.0–100.0)
Platelets: 327 10*3/uL (ref 150–440)
RBC: 3.98 MIL/uL (ref 3.80–5.20)
RDW: 16 % — AB (ref 11.5–14.5)
WBC: 14.7 10*3/uL — ABNORMAL HIGH (ref 3.6–11.0)

## 2018-02-02 LAB — BASIC METABOLIC PANEL
Anion gap: 11 (ref 5–15)
BUN: 54 mg/dL — AB (ref 6–20)
CO2: 26 mmol/L (ref 22–32)
Calcium: 8.6 mg/dL — ABNORMAL LOW (ref 8.9–10.3)
Chloride: 109 mmol/L (ref 101–111)
Creatinine, Ser: 1.01 mg/dL — ABNORMAL HIGH (ref 0.44–1.00)
GFR calc Af Amer: >60 mL/min (ref >60)
GFR, EST NON AFRICAN AMERICAN: 53 mL/min — AB (ref >60)
GLUCOSE: 194 mg/dL — AB (ref 65–99)
POTASSIUM: 3.9 mmol/L (ref 3.5–5.1)
Sodium: 146 mmol/L — ABNORMAL HIGH (ref 135–145)

## 2018-02-02 LAB — CULTURE, BLOOD (ROUTINE X 2)
CULTURE: NO GROWTH
Culture: NO GROWTH
SPECIAL REQUESTS: ADEQUATE
Special Requests: ADEQUATE

## 2018-02-02 LAB — HEPATIC FUNCTION PANEL
ALK PHOS: 55 U/L (ref 38–126)
ALT: 40 U/L (ref 14–54)
AST: 34 U/L (ref 15–41)
Albumin: 2.9 g/dL — ABNORMAL LOW (ref 3.5–5.0)
BILIRUBIN TOTAL: 0.6 mg/dL (ref 0.3–1.2)
Bilirubin, Direct: <0.1 mg/dL — ABNORMAL LOW (ref 0.1–0.5)
TOTAL PROTEIN: 6.9 g/dL (ref 6.5–8.1)

## 2018-02-02 LAB — GLUCOSE, CAPILLARY
GLUCOSE-CAPILLARY: 175 mg/dL — AB (ref 65–99)
GLUCOSE-CAPILLARY: 175 mg/dL — AB (ref 65–99)
GLUCOSE-CAPILLARY: 194 mg/dL — AB (ref 65–99)
Glucose-Capillary: 162 mg/dL — ABNORMAL HIGH (ref 65–99)
Glucose-Capillary: 196 mg/dL — ABNORMAL HIGH (ref 65–99)
Glucose-Capillary: 210 mg/dL — ABNORMAL HIGH (ref 65–99)
Glucose-Capillary: 219 mg/dL — ABNORMAL HIGH (ref 65–99)
Glucose-Capillary: 252 mg/dL — ABNORMAL HIGH (ref 65–99)

## 2018-02-02 LAB — MAGNESIUM: Magnesium: 2.6 mg/dL — ABNORMAL HIGH (ref 1.7–2.4)

## 2018-02-02 LAB — VALPROIC ACID LEVEL: Valproic Acid Lvl: 11 ug/mL — ABNORMAL LOW (ref 50.0–100.0)

## 2018-02-02 LAB — PHOSPHORUS: PHOSPHORUS: 2.9 mg/dL (ref 2.5–4.6)

## 2018-02-02 LAB — VDRL, CSF: VDRL Quant, CSF: NONREACTIVE

## 2018-02-02 MED ORDER — SODIUM CHLORIDE 0.9 % IV SOLN
2.0000 g | INTRAVENOUS | Status: DC
  Administered 2018-02-02: 2 g via INTRAVENOUS
  Filled 2018-02-02: qty 2
  Filled 2018-02-02: qty 20

## 2018-02-02 MED ORDER — VALPROATE SODIUM 500 MG/5ML IV SOLN
1000.0000 mg | Freq: Two times a day (BID) | INTRAVENOUS | Status: DC
  Administered 2018-02-02: 1000 mg via INTRAVENOUS
  Filled 2018-02-02 (×3): qty 10

## 2018-02-02 MED ORDER — DEXTROSE 5 % IV SOLN
INTRAVENOUS | Status: DC
  Administered 2018-02-02: 50 mL/h via INTRAVENOUS
  Administered 2018-02-02: 12:00:00 via INTRAVENOUS

## 2018-02-02 MED ORDER — FREE WATER
200.0000 mL | Freq: Three times a day (TID) | Status: DC
  Administered 2018-02-02 – 2018-02-04 (×6): 200 mL

## 2018-02-02 MED ORDER — SODIUM CHLORIDE 0.9% FLUSH
10.0000 mL | Freq: Two times a day (BID) | INTRAVENOUS | Status: DC
  Administered 2018-02-02: 35 mL
  Administered 2018-02-03: 10 mL
  Administered 2018-02-03: 20 mL
  Administered 2018-02-04 (×2): 10 mL
  Administered 2018-02-05: 22:00:00 30 mL
  Administered 2018-02-05 – 2018-02-06 (×2): 10 mL

## 2018-02-02 MED ORDER — ENOXAPARIN SODIUM 40 MG/0.4ML ~~LOC~~ SOLN: 40.0000 mg | SUBCUTANEOUS | Status: DC

## 2018-02-02 MED ORDER — INSULIN GLARGINE 100 UNIT/ML ~~LOC~~ SOLN
10.0000 [IU] | Freq: Every day | SUBCUTANEOUS | Status: DC
  Administered 2018-02-02 – 2018-02-05 (×4): 10 [IU] via SUBCUTANEOUS
  Filled 2018-02-02 (×4): qty 0.1

## 2018-02-02 MED ORDER — SODIUM CHLORIDE 0.9% FLUSH: 10.0000 mL | INTRAVENOUS | Status: DC | PRN

## 2018-02-02 MED ORDER — VALPROATE SODIUM 500 MG/5ML IV SOLN
750.0000 mg | Freq: Once | INTRAVENOUS | Status: AC
  Administered 2018-02-02: 750 mg via INTRAVENOUS
  Filled 2018-02-02: qty 7.5

## 2018-02-02 MED ORDER — ENOXAPARIN SODIUM 40 MG/0.4ML ~~LOC~~ SOLN
40.0000 mg | SUBCUTANEOUS | Status: DC
  Administered 2018-02-02 – 2018-02-05 (×4): 40 mg via SUBCUTANEOUS
  Filled 2018-02-02 (×4): qty 0.4

## 2018-02-02 NOTE — Progress Notes (Signed)
Pt intubated, no sedation. Possible posturing, withdraws to pain, all extremities. Spontaneous movement of the leg. No eye opening, pupils are round and reactive. Pt had one IV access. PICC ordered, per DrMarland Kitchen Alva Garnet. RN from IV team unable to unable to thread catheter through the vein. Central placed by Dr. Alva Garnet.

## 2018-02-02 NOTE — [place] Notes (Signed)
[place] INFECTIOUS DISEASE PROGRESS NOTE Date of Admission:  01/28/2018     ID: BIRGITTA UHLIR is a 77 y.o. female with hypoglycemia, sepsis Principal Problem:   Sepsis ([place]) Active Problems:   Acute respiratory failure with hypoxia (HCC)   HTN (hypertension)   Asthma   Diabetes ([place])   Hyperkalemia   Altered mental status   Subjective: Extubated, no fevers. WBC down to 14. On depakote. Remains unresponsive.   ROS  Unable to obtain  Medications:  Antibiotics Given (last 72 hours)    Date/Time Action Medication Dose Rate   01/30/18 2155 New Bag/Given   cefTRIAXone (ROCEPHIN) 2 g in sodium chloride 0.9 % 100 mL IVPB 2 g 200 mL/hr   01/31/18 2213 New Bag/Given   cefTRIAXone (ROCEPHIN) 2 g in sodium chloride 0.9 % 100 mL IVPB 2 g 200 mL/hr   02/01/18 2240 New Bag/Given   cefTRIAXone (ROCEPHIN) 2 g in sodium chloride 0.9 % 100 mL IVPB 2 g 200 mL/hr     . chlorhexidine gluconate (MEDLINE KIT)  15 mL Mouth Rinse BID  . enoxaparin (LOVENOX) injection  40 mg Subcutaneous Q24H  . famotidine  20 mg Oral BID  . free water  200 mL Per Tube Q8H  . insulin aspart  0-15 Units Subcutaneous Q4H  . insulin glargine  10 Units Subcutaneous Daily  . mouth rinse  15 mL Mouth Rinse 10 times per day  . multivitamin  15 mL Per Tube Daily    Objective: Vital signs in last 24 hours: Temp:  [97.8 F (36.6 C)-99.3 F (37.4 C)] 98.8 F (37.1 C) (06/17 1200) Pulse Rate:  [79-112] 94 (06/17 1600) Resp:  [15-31] 17 (06/17 1600) BP: (130-188)/(54-85) 165/74 (06/17 1600) SpO2:  [85 %-99 %] 98 % (06/17 1600) FiO2 (%):  [24 %] 24 % (06/17 1130) Weight:  [95.7 kg (210 lb 15.7 oz)] 95.7 kg (210 lb 15.7 oz) (06/17 0500) Constitutional:  unresponsive HENT: [place]/AT, PERRLA, no scleral icterus,  Mouth/Throat: does not open mouth  Cardiovascular: Normal rate, regular rhythm and normal heart sounds. Pulmonary/Chest: clear  Neck = supple, no nuchal rigidity Abdominal: Soft. Bowel sounds are normal.   exhibits no distension. There is no tenderness.  Lymphadenopathy: no cervical adenopathy. No axillary adenopathy Neurological: withdraws some to pain. Not responsive otherwise Skin: Skin is warm and dry. No rash noted. No erythema.  Ext 1+ edema Psychiatric: unresponsive  Lab Results Recent Labs    02/01/18 0517 02/02/18 0443  WBC 18.1* 14.7*  HGB 12.0 11.6*  HCT 37.3 35.6  NA 141 146*  K 5.0 3.9  CL 105 109  CO2 26 26  BUN 51* 54*  CREATININE 1.23* 1.01*    Microbiology: Results for orders placed or performed during the hospital encounter of 01/28/18  CULTURE, BLOOD (ROUTINE X 2) w Reflex to ID Panel     Status: None   Collection Time: 01/28/18  9:30 PM  Result Value Ref Range Status   Specimen Description BLOOD LEFT ANTECUBITAL  Final   Special Requests   Final    BOTTLES DRAWN AEROBIC AND ANAEROBIC Blood Culture adequate volume   Culture   Final    NO GROWTH 5 DAYS Performed at [facility], [address] [place], [place] [address]    Report Status 02/02/2018 FINAL  Final  CULTURE, BLOOD (ROUTINE X 2) w Reflex to ID Panel     Status: None   Collection Time: 01/28/18  9:45 PM  Result Value Ref Range  Status   Specimen Description BLOOD BLOOD LEFT ARM  Final   Special Requests   Final    BOTTLES DRAWN AEROBIC AND ANAEROBIC Blood Culture adequate volume   Culture   Final    NO GROWTH 5 DAYS Performed at [facility], [address] [place], [place] [address]    Report Status 02/02/2018 FINAL  Final  MRSA PCR Screening     Status: None   Collection Time: 01/28/18 11:19 PM  Result Value Ref Range Status   MRSA by PCR NEGATIVE NEGATIVE Final    Comment:        The GeneXpert MRSA Assay (FDA approved for NASAL specimens only), is one component of a comprehensive MRSA colonization surveillance program. It is not intended to diagnose MRSA infection nor to guide or monitor treatment for MRSA infections. Performed at [facility],  [name], [place], [place] [address]   Urine Culture     Status: Abnormal   Collection Time: 01/29/18  1:06 AM  Result Value Ref Range Status   Specimen Description   Final    URINE, RANDOM Performed at [facility], [address], [place], [place] [address]    Special Requests   Final    NONE Performed at [facility], [address] [place], [place] [address]    Culture (A)  Final    <10,000 COLONIES/mL INSIGNIFICANT GROWTH Performed at [place] [address] [place], [place] [address]    Report Status 01/30/2018 FINAL  Final  Culture, respiratory (NON-Expectorated)     Status: None   Collection Time: 01/29/18 11:44 AM  Result Value Ref Range Status   Specimen Description   Final    TRACHEAL ASPIRATE Performed at [facility], [address] [place], [place] [address]    Special Requests   Final    NONE Performed at [facility], [address] [place], [place] [address]    Gram Stain   Final    ABUNDANT WBC PRESENT,BOTH PMN AND MONONUCLEAR RARE GRAM POSITIVE COCCI    Culture   Final    Consistent with normal respiratory flora. Performed at [facility], [place] [address] [place], [place] [address]    Report Status 02/01/2018 FINAL  Final  CSF culture     Status: None (Preliminary result)   Collection Time: 01/30/18  8:56 AM  Result Value Ref Range Status   Specimen Description   Final    CSF Performed at [facility], [address] [place], [place] [address]    Special Requests   Final    NONE Performed at [facility], [address] [place], [place] [address]    Gram Stain   Final    NO ORGANISMS SEEN FEW WBC SEEN MODERATE RED BLOOD CELLS Performed at [facility], [address], [place], [place] [address]    Culture   Final    NO GROWTH 3 DAYS Performed at [facility], [place] [address], [place], [place] [address]    Report Status PENDING   Incomplete  C difficile quick scan w PCR reflex     Status: None   Collection Time: 02/01/18  4:09 AM  Result Value Ref Range Status   C Diff antigen NEGATIVE NEGATIVE Final   C Diff toxin NEGATIVE NEGATIVE Final   C Diff interpretation No C. difficile detected.  Final    Comment: Performed at [facility], [place]  [address], [place], [place] [address]    Studies/Results: Dg Abd 1 View  Result Date: 02/02/2018 CLINICAL DATA:  NG tube placement EXAM: ABDOMEN - 1 VIEW COMPARISON:  01/28/2018 FINDINGS: NG tube tip is in the mid stomach. Prior cholecystectomy. Mild diffuse gaseous distention of bowel. IMPRESSION: NG tube tip in the mid stomach. Electronically Signed   By: Rolm Baptise M.D.   On: 02/02/2018 12:50   Dg Chest Port 1 View  Result Date: 02/02/2018 CLINICAL DATA:  Acute respiratory failure, sepsis, hypoxia. EXAM: PORTABLE CHEST 1 VIEW COMPARISON:  Portable chest x-ray of February 01, 2018 FINDINGS: The lungs are adequately inflated. There is no focal infiltrate. The interstitial markings are mildly prominent in the infrahilar regions bilaterally. The heart is top-normal in size. The pulmonary vascularity is not engorged. The endotracheal tube tip projects approximately 6.2 cm above the carina. The esophagogastric tube tip projects below the inferior margin of the image. IMPRESSION: Slight increase conspicuity of bibasilar densities, likely atelectasis. No overt pulmonary edema. The support tubes are in reasonable position. Electronically Signed   By: David  Martinique M.D.   On: 02/02/2018 07:17   Dg Chest Port 1 View  Result Date: 02/01/2018 CLINICAL DATA:  Patient poor historian at this time. Encounter for patient on mechanically assisted ventilation. EXAM: PORTABLE CHEST - 1 VIEW COMPARISON:  01/30/2018 FINDINGS: Endotracheal tube and nasogastric tube stable in position. Lungs are clear. Heart size upper limits normal for technique. No pneumothorax No effusion. Visualized bones  unremarkable. IMPRESSION: No acute cardiopulmonary disease. Support hardware stable in position. Electronically Signed   By: Lucrezia Europe M.D.   On: 02/01/2018 10:21   [place] Ekg Site Rite  Result Date: 02/02/2018 If Site Rite image not attached, placement could not be confirmed due to current cardiac rhythm.   Assessment/Plan: ERICCA LABRA is a 77 y.o. female found unresponsive after last being seen approx 24 hours prior. She had recently started steroids for a bronchitis and her sugars had been elevated so she had been increasing her insulin dosing. She finished prednisone on Monday and on Tuesday sugars were going as low as 30s and also high.  She then apparently went to bed Tuesday night, her daughter visited her midday Wed and heard her snoring so assumed she was napping, then found her later at 5 pm completely unresponsive.   Sugars in the 30s when found by EMS. She was treated and sugars improved but no improvement in MS. Intubated in ED.  Family denies any prodrome of fevers, chills, HA, neck pain, abd pain. She did have the cough over the last week. No travel, no pets. Does not leave the house much. No known sick contacts On admit wbc was 14, temp 103. CXR negative. Ct head and neck negative. She had elevated lactic acid, mildly elevated CK, ARF, UA negative. tox screen + benzos. She was started on IV vanco, cefepime then changed to meropenem and acyclovir added. She has severe PCN allergy per family.   I do not think it likely she has meningitis (neck is supple) and think she probably had prolonged hypoglycemia and that is the cause of her encephalopathy.  6/14- LP unimpressive, MRI negative. Remains intubated.  6/17 - No fevers, wbc down to 14. Remains confused. EEG non conclusive. day 5 ceftriaxone.  6/18 - no fevers. cxs negs, HSV PCR neg, VDRL neg Recommendations Agree with remaining off abx and monitoring. Repeat EEG planned. Thank you very much for the consult. Will follow with  you.  Leonel Ramsay   02/02/2018, 4:11 PM

## 2018-02-02 NOTE — [place] Notes (Addendum)
PROCEDURE NOTE: L IJ CVL PLACEMENT  INDICATION:    Monitoring of central venous pressures and/or administration of medications optimally administered in central vein  CONSENT:   Risks of procedure as well as the alternatives were explained to the patient or surrogate. Consent for procedure obtained. A time out was performed.   PROCEDURE  Sterile technique was used including antiseptics, cap, gloves, gown, hand hygiene, mask and full body sheet.  Skin prep: Chlorhexidine; local anesthetic administered  A triple lumen catheter was placed in the L IJ vein using the Seldinger technique after unsuccessful attempt @ [facility] [place] site Ultrasound was used for vessel identification and guidance.   EVALUATION:  Blood flow good  Complications: No apparent complications  Patient tolerated the procedure well.  Chest X-ray ordered to verify placement and is pending   Merton Border, MD PCCM service Mobile [phone] 02/02/2018 4:34 PM

## 2018-02-02 NOTE — [place] Notes (Signed)
[place] [facility] [place] NAME: Sabrina Quinn    MR#:  [id number]  DATE OF BIRTH:  [birth year]  SUBJECTIVE:  CHIEF COMPLAINT:   Chief Complaint  Patient presents with  . Altered Mental Status   Continues on full ventilatory support.  Off sedation for 48 hrs  REVIEW OF SYSTEMS:  Review of Systems  Unable to perform ROS: Intubated   DRUG ALLERGIES:   Allergies  Allergen Reactions  . Penicillins Rash   VITALS:  Blood pressure (!) 188/82, pulse 97, temperature 99 F (37.2 C), temperature source Rectal, resp. rate 19, height 5\' 4"  (1.626 m), weight 95.7 kg (210 lb 15.7 oz), SpO2 91 %. PHYSICAL EXAMINATION:  Physical Exam  HENT:  Head: Normocephalic and atraumatic.  ETT in place  Eyes: Pupils are equal, round, and reactive to light. Conjunctivae and EOM are normal.  Neck: Normal range of motion. Neck supple. No tracheal deviation present. No thyromegaly present.  Cardiovascular: Normal rate, regular rhythm and normal heart sounds.  Pulmonary/Chest: Effort normal and breath sounds normal. No respiratory distress. She has no wheezes. She exhibits no tenderness.  Abdominal: Soft. Bowel sounds are normal. She exhibits no distension. There is no tenderness.  Musculoskeletal: Normal range of motion.  Neurological: No cranial nerve deficit.  Sedated on vent  Skin: Skin is warm and dry. No rash noted.  Psychiatric:  Unable to assess due to on vent   LABORATORY PANEL:  Female CBC Recent Labs  Lab 02/02/18 0443  WBC 14.7*  HGB 11.6*  HCT 35.6  PLT 327   ------------------------------------------------------------------------------------------------------------------ Chemistries  Recent Labs  Lab 02/02/18 0443  NA 146*  K 3.9  CL 109  CO2 26  GLUCOSE 194*  BUN 54*  CREATININE 1.01*  CALCIUM 8.6*  MG 2.6*  AST 34  ALT 40  ALKPHOS 55  BILITOT 0.6   RADIOLOGY:  Dg Chest Port 1 View  Result Date: 02/02/2018 CLINICAL DATA:   Acute respiratory failure, sepsis, hypoxia. EXAM: PORTABLE CHEST 1 VIEW COMPARISON:  Portable chest x-ray of February 01, 2018 FINDINGS: The lungs are adequately inflated. There is no focal infiltrate. The interstitial markings are mildly prominent in the infrahilar regions bilaterally. The heart is top-normal in size. The pulmonary vascularity is not engorged. The endotracheal tube tip projects approximately 6.2 cm above the carina. The esophagogastric tube tip projects below the inferior margin of the image. IMPRESSION: Slight increase conspicuity of bibasilar densities, likely atelectasis. No overt pulmonary edema. The support tubes are in reasonable position. Electronically Signed   By: David  Martinique M.D.   On: 02/02/2018 07:17   [place] Ekg Site Rite  Result Date: 02/02/2018 If Site Rite image not attached, placement could not be confirmed due to current cardiac rhythm.  ASSESSMENT AND PLAN:   * Sepsis: present on admission. Continue empiric Abx. LP performed this am - CSF results not consistent with meningitis .  On IV ceftriaxone  * Acute metabolic encephalopathy: could be due to hypoglycemia, unlikely meningitis per ID, Neuro - Unclear if there may be some hypoglycemic injury to brain.   - MRI of the brain -nothing acute -EEG was inconclusive Started on Depakote for possible seizures.  * Acute respiratory failure with hypoxia (HCC) - remains on vent mgmt per PCCM  * HTN (hypertension) -stable, continue home meds  * Diabetes ([place]) -sliding scale insulin with corresponding glucose checks  Poor prognosis .    All the records are reviewed and case discussed with  Care Management/Social Worker. Management plans discussed with the patient, nursing and they are in agreement.  CODE STATUS: Full Code  TOTAL TIME TAKING CARE OF THIS PATIENT: 25 minutes.   POSSIBLE D/C IN 1-2 DAYS, DEPENDING ON CLINICAL CONDITION.   Neita Carp M.D on 02/02/2018 at 12:11 PM  Between 7am to 6pm - Pager -  [phone]  After 6pm go to [email or web address] - Proofreader  [facility]  Office  [phone]  CC: Primary care physician; Cletis Athens, MD  Note: This dictation was prepared with Dragon dictation along with smaller phrase technology. Any transcriptional errors that result from this process are unintentional.

## 2018-02-02 NOTE — [place] Notes (Signed)
Inpatient Diabetes Program Recommendations  AACE/ADA: New Consensus Statement on Inpatient Glycemic Control (2015)  Target Ranges:  Prepandial:   less than 140 mg/dL      Peak postprandial:   less than 180 mg/dL (1-2 hours)      Critically ill patients:  140 - 180 mg/dL   Results for Sabrina Quinn, Sabrina Quinn (MRN [id number]) as of 02/02/2018 10:48  Ref. Range 01/31/2018 23:21 02/01/2018 04:23 02/01/2018 05:19 02/01/2018 07:29 02/01/2018 11:28 02/01/2018 15:53 02/01/2018 19:52  Glucose-Capillary Latest Ref Range: 65 - 99 mg/dL 182 (H)  3 units NOVOLOG 167 (H)  3 units NOVOLOG 166 (H) 198 (H)  3 units NOVOLOG 224 (H)  5 units NOVOLOG 220 (H)  5 units NOVOLOG 181 (H)  3 units NOVOLOG   Results for Sabrina Quinn, Sabrina Quinn (MRN [id number]) as of 02/02/2018 10:48  Ref. Range 02/02/2018 00:05 02/02/2018 03:48 02/02/2018 07:17 02/02/2018 07:26  Glucose-Capillary Latest Ref Range: 65 - 99 mg/dL 252 (H)  8 units NOVOLOG 196 (H)  3 units NOVOLOG 175 (H)  3 units NOVOLOG  175 (H)    Home DM Meds: Lantus 30 units (unknown frequency)       Metformin 500 mg BID  Current Insulin Orders: Novolog Moderate Correction Scale/ SSI (0-15 units) Q4 hours      Note patient remains intubated.  Getting Vital High Protein Tube feeds at 55 cc/hour.  Having occasional glucose elevations.     MD- Please consider starting Novolog tube feed coverage:  Novolog 3 units Q4 hours (hold if Tube feeds held for any reason)  Continue Novolog SSI     --Will follow patient during hospitalization--  Wyn Quaker RN, MSN, CDE Diabetes Coordinator Inpatient Glycemic Control Team Team Pager: [phone] (8a-5p)

## 2018-02-02 NOTE — Progress Notes (Addendum)
Subjective: Patient remains intubated.    Objective: Current vital signs: BP (!) 167/75   Pulse 96   Temp 99 F (37.2 C) (Rectal)   Resp 18   Ht 5' 4" (1.626 m)   Wt 95.7 kg (210 lb 15.7 oz)   SpO2 98%   BMI 36.21 kg/m  Vital signs in last 24 hours: Temp:  [97.8 F (36.6 C)-99.3 F (37.4 C)] 99 F (37.2 C) (06/17 0800) Pulse Rate:  [77-112] 96 (06/17 0900) Resp:  [14-30] 18 (06/17 0900) BP: (130-182)/(60-81) 167/75 (06/17 0900) SpO2:  [85 %-99 %] 98 % (06/17 0900) FiO2 (%):  [24 %] 24 % (06/17 0820) Weight:  [95.7 kg (210 lb 15.7 oz)] 95.7 kg (210 lb 15.7 oz) (06/17 0500)  Intake/Output from previous day: 06/16 0701 - 06/17 0700 In: 1675 [I.V.:220; NG/GT:1310] Out: 1625 [Urine:1625] Intake/Output this shift: Total I/O In: 260 [I.V.:40; NG/GT:220] Out: 200 [Urine:200] Nutritional status:  Diet Order    None      Neurologic Exam: Mental Status: Patient does not respond to verbal stimuli.  Does not respond to deep sternal rub.  Does not follow commands.  No verbalizations noted.  Cranial Nerves: II: patient does not respond confrontation bilaterally, pupils right 4 mm, left 4 mm,and reactive bilaterally III,IV,VI: doll's response present bilaterally.  V,VII: corneal reflex present bilaterally  VIII: patient does not respond to verbal stimuli IX,X: gag reflex unable to be tested, XI: trapezius strength unable to test bilaterally XII: tongue strength unable to test Motor: Tone improved in the lower extremities, left greater than right Sensory: Withdraws to noxious stimuli in all extremities, lifting both lower extremities off the bed. Plantars: upgoing bilaterally Cerebellar: Unable to perform   Lab Results: Basic Metabolic Panel: Recent Labs  Lab 01/28/18 2319 01/29/18 0539 01/30/18 1021 01/31/18 0600 01/31/18 2159 02/01/18 0517 02/02/18 0443  NA  --  137 138 138 141 141 146*  K  --  3.6 3.2* 3.4* 3.1* 5.0 3.9  CL  --  102 98* 98* 100* 105 109   CO2  --  17* _0 GLUCOSE  --  164* 124* 201* 189* 169* 194*  BUN  --  45* 52* 60* 53* 51* 54*  CREATININE 1.40* 1.44* 1.43* 1.28* 1.25* 1.23* 1.01*  CALCIUM  --  8.1* 8.1* 7.9* 8.4* 8.3* 8.6*  MG 2.2 2.4  --  2.4  --  2.4 2.6*  PHOS 3.4 3.9  --  3.6  --  3.3 2.9    Liver Function Tests: Recent Labs  Lab 01/28/18 1855 02/02/18 0443  AST 129* 34  ALT 96* 40  ALKPHOS 97 55  BILITOT 0.9 0.6  PROT 7.4 6.9  ALBUMIN 3.5 2.9*   No results for input(s): LIPASE, AMYLASE in the last 168 hours. No results for input(s): AMMONIA in the last 168 hours.  CBC: Recent Labs  Lab 01/28/18 1855  01/29/18 0539 01/30/18 1021 01/31/18 0600 02/01/18 0517 02/02/18 0443  WBC 14.9*   < > 23.1* 17.6* 17.2* 18.1* 14.7*  NEUTROABS 13.6*  --   --   --   --   --   --   HGB 14.9   < > 13.2 12.2 11.9* 12.0 11.6*  HCT 45.9   < > 41.8 37.2 36.8 37.3 35.6  MCV 88.3   < > 88.2 87.7 87.9 88.4 89.6  PLT 472*   < > 365 329 295 350 327   < > = values in this interval  not displayed.    Cardiac Enzymes: Recent Labs  Lab 01/28/18 1855 01/29/18 0703 01/31/18 0600  CKTOTAL  --  816* 571*  TROPONINI <0.03  --   --     Lipid Panel: Recent Labs  Lab 01/31/18 0608  TRIG 77    CBG: Recent Labs  Lab 02/01/18 1952 02/02/18 0005 02/02/18 0348 02/02/18 0717 02/02/18 0726  GLUCAP 181* 252* 196* 175* 175*    Microbiology: Results for orders placed or performed during the hospital encounter of 01/28/18  CULTURE, BLOOD (ROUTINE X 2) w Reflex to ID Panel     Status: None   Collection Time: 01/28/18  9:30 PM  Result Value Ref Range Status   Specimen Description BLOOD LEFT ANTECUBITAL  Final   Special Requests   Final    BOTTLES DRAWN AEROBIC AND ANAEROBIC Blood Culture adequate volume   Culture   Final    NO GROWTH 5 DAYS Performed at [facility], [address] [place], [place] [address]    Report Status 02/02/2018 FINAL  Final  CULTURE, BLOOD (ROUTINE X 2) w Reflex to  ID Panel     Status: None   Collection Time: 01/28/18  9:45 PM  Result Value Ref Range Status   Specimen Description BLOOD BLOOD LEFT ARM  Final   Special Requests   Final    BOTTLES DRAWN AEROBIC AND ANAEROBIC Blood Culture adequate volume   Culture   Final    NO GROWTH 5 DAYS Performed at [facility], [address] [place], [place] [address]    Report Status 02/02/2018 FINAL  Final  MRSA PCR Screening     Status: None   Collection Time: 01/28/18 11:19 PM  Result Value Ref Range Status   MRSA by PCR NEGATIVE NEGATIVE Final    Comment:        The GeneXpert MRSA Assay (FDA approved for NASAL specimens only), is one component of a comprehensive MRSA colonization surveillance program. It is not intended to diagnose MRSA infection nor to guide or monitor treatment for MRSA infections. Performed at [facility], [address], [place], [place] [address]   Urine Culture     Status: Abnormal   Collection Time: 01/29/18  1:06 AM  Result Value Ref Range Status   Specimen Description   Final    URINE, RANDOM Performed at [facility], [address], [place], [place] [address]    Special Requests   Final    NONE Performed at [facility], [address] [place], [place] [address]    Culture (A)  Final    <10,000 COLONIES/mL INSIGNIFICANT GROWTH Performed at [facility] [address], [place], [place] [address]    Report Status 01/30/2018 FINAL  Final  Culture, respiratory (NON-Expectorated)     Status: None   Collection Time: 01/29/18 11:44 AM  Result Value Ref Range Status   Specimen Description   Final    TRACHEAL ASPIRATE Performed at [facility], [address], [place], [place] [address]    Special Requests   Final    NONE Performed at [facility], [address] [place], [place] [address]    Gram Stain   Final    ABUNDANT WBC PRESENT,BOTH PMN AND MONONUCLEAR RARE GRAM POSITIVE COCCI     Culture   Final    Consistent with normal respiratory flora. Performed at [facility], [place] [address] [place],  [address]    Report  Status 02/01/2018 FINAL  Final  CSF culture     Status: None (Preliminary result)   Collection Time: 01/30/18  8:56 AM  Result Value Ref Range Status   Specimen Description   Final    CSF Performed at [facility], [address], [place], [place] [address]    Special Requests   Final    NONE Performed at [facility], [address] [place], [place] [address]    Gram Stain   Final    NO ORGANISMS SEEN FEW WBC SEEN MODERATE RED BLOOD CELLS Performed at [facility], [address], [place], [place] [address]    Culture   Final    NO GROWTH 2 DAYS Performed at [facility], [place] [address], [place], [place] [address]    Report Status PENDING  Incomplete  C difficile quick scan w PCR reflex     Status: None   Collection Time: 02/01/18  4:09 AM  Result Value Ref Range Status   C Diff antigen NEGATIVE NEGATIVE Final   C Diff toxin NEGATIVE NEGATIVE Final   C Diff interpretation No C. difficile detected.  Final    Comment: Performed at [facility], [address] [place], [place] [address]    Coagulation Studies: No results for input(s): LABPROT, INR in the last 72 hours.  Imaging: Dg Chest Port 1 View  Result Date: 02/02/2018 CLINICAL DATA:  Acute respiratory failure, sepsis, hypoxia. EXAM: PORTABLE CHEST 1 VIEW COMPARISON:  Portable chest x-ray of February 01, 2018 FINDINGS: The lungs are adequately inflated. There is no focal infiltrate. The interstitial markings are mildly prominent in the infrahilar regions bilaterally. The heart is top-normal in size. The pulmonary vascularity is not engorged. The endotracheal tube tip projects approximately 6.2 cm above the carina. The esophagogastric tube tip projects below the inferior margin of the image. IMPRESSION: Slight increase conspicuity of  bibasilar densities, likely atelectasis. No overt pulmonary edema. The support tubes are in reasonable position. Electronically Signed   By: David  Martinique M.D.   On: 02/02/2018 07:17   Dg Chest Port 1 View  Result Date: 02/01/2018 CLINICAL DATA:  Patient poor historian at this time. Encounter for patient on mechanically assisted ventilation. EXAM: PORTABLE CHEST - 1 VIEW COMPARISON:  01/30/2018 FINDINGS: Endotracheal tube and nasogastric tube stable in position. Lungs are clear. Heart size upper limits normal for technique. No pneumothorax No effusion. Visualized bones unremarkable. IMPRESSION: No acute cardiopulmonary disease. Support hardware stable in position. Electronically Signed   By: Lucrezia Europe M.D.   On: 02/01/2018 10:21   [place] Ekg Site Rite  Result Date: 02/02/2018 If Site Rite image not attached, placement could not be confirmed due to current cardiac rhythm.   Medications:  I have reviewed the patient's current medications. Scheduled: . chlorhexidine gluconate (MEDLINE KIT)  15 mL Mouth Rinse BID  . enoxaparin (LOVENOX) injection  40 mg Subcutaneous Q24H  . famotidine  20 mg Oral BID  . free water  200 mL Per Tube Q8H  . insulin aspart  0-15 Units Subcutaneous Q4H  . mouth rinse  15 mL Mouth Rinse 10 times per day  . multivitamin  15 mL Per Tube Daily    Assessment/Plan: Patient remains minimally responsive.  Depakote started on yesterday.  Level today at 11.  LFT's normal.  Recommendations: 1.  Depacon 762m now with increase in maintenance to 10024mq 12 hours 2.  Depakote level in AM   LOS: 5 days  Alexis Goodell, MD Neurology [phone] 02/02/2018  10:05 AM

## 2018-02-02 NOTE — Progress Notes (Signed)
Peripherally Inserted Central Catheter/Midline Placement  The IV Nurse has discussed with the patient and/or persons authorized to consent for the patient, the purpose of this procedure and the potential benefits and risks involved with this procedure.  The benefits include less needle sticks, lab draws from the catheter, and the patient may be discharged home with the catheter. Risks include, but not limited to, infection, bleeding, blood clot (thrombus formation), and puncture of an artery; nerve damage and irregular heartbeat and possibility to perform a PICC exchange if needed/ordered by physician.  Alternatives to this procedure were also discussed.  Bard Power PICC patient education guide, fact sheet on infection prevention and patient information card has been provided to patient /or left at bedside.    PICC/Midline Placement Documentation  Attempt to place PICC unsuccessful.  Very difficult to gain access to veins due to small size and being deep.  Gained access to brachial vein after four attempts.  Not able to thread PICC into SVC possibly meeting some type of obstruction?  Various techniques including use of radiology guide wire attempted.          Sabrina Quinn 02/02/2018, 2:35 PM

## 2018-02-02 NOTE — Progress Notes (Signed)
PULMONARY / CRITICAL CARE MEDICINE   Name: Sabrina Quinn MRN: [id number] DOB: May 01, 1941    ADMISSION DATE:  01/28/2018  PT PROFILE: 29 F with history of DM, found by family unresponsive.  Severe hypoglycemia noted by EMS.  Cognition did not improve with dextrose infusion.  Intubated in ED for altered cognition.  High fever noted, diagnosis of severe sepsis of unknown origin given.  MAJOR EVENTS/TEST RESULTS: 6/12 admission as above 6/12 CT head and neck: No acute findings 6/13 minimally responsive off of all sedation.  High fevers persist.  Antibiotics changed to cover possible meningitis and HSV encephalitis.  Neurology consultation requested.  LP ordered but cannot be performed due to recent enoxaparin. 6/14 MRI brain: No acute abnormality 6/14 EEG: Muscle and movement artifact.  No interpretation could be made 6/17 minimally responsive despite no sedation in past 24 hours.  Coughs upon suctioning.  No significant respiratory secretions.  Tolerates minimal ventilator support.  Withdraws all extremities  INDWELLING DEVICES:: ETT 06/13 >>  PICC 6/17 >>   MICRO DATA: MRSA PCR 6/12 >> NEG Urine 6/12 >> insignificant growth Resp 6/13 >> NOF Blood 6/12 >> NEG CSF 6/14 >> no leukocytosis, bacterial culture negative CSF HSV PCR 06/14 >> NEG  ANTIMICROBIALS:  Cefepime 6/12 >> 6/13 Vanc 6/12 >> 6/14 Meropenem 6/13 >> 6/14 Acyclovir 6/13 >> 6/14 Ceftriaxone 6/14 >>    SUBJECTIVE:  RASS -4.  Withdrawals all extremities.  Tolerating SBT.  Coughs upon suctioning.  Minimal respiratory secretions  VITAL SIGNS: BP (!) 188/82   Pulse 97   Temp 99 F (37.2 C) (Rectal)   Resp 19   Ht 5\' 4"  (1.626 m)   Wt 210 lb 15.7 oz (95.7 kg)   SpO2 91%   BMI 36.21 kg/m   HEMODYNAMICS:    VENTILATOR SETTINGS: Vent Mode: PSV FiO2 (%):  [24 %] 24 % PEEP:  [5 cmH20] 5 cmH20 Pressure Support:  [0 cmH20-5 cmH20] 0 cmH20 Plateau Pressure:  [13 cmH20-18 cmH20] 18 cmH20  INTAKE / OUTPUT: I/O  last 3 completed shifts: In: 2735 [I.V.:330; Other:190; NG/GT:2115; IV Piggyback:100] Out: 2500 [Urine:2500]  PHYSICAL EXAMINATION: General: Intubated, remains unresponsive off of all sedation Neuro: PERRLA, EOMI, withdraws all extremities, not F/C HEENT: NCAT, sclerae white Cardiovascular: Regular, no M Lungs: Clear anteriorly without adventitious sounds Abdomen: Soft, NT, + BS Extremities: Warm, no edema Skin: No lesions noted  LABS:  BMET Recent Labs  Lab 01/31/18 2159 02/01/18 0517 02/02/18 0443  NA 141 141 146*  K 3.1* 5.0 3.9  CL 100* 105 109  CO2 28 26 26   BUN 53* 51* 54*  CREATININE 1.25* 1.23* 1.01*  GLUCOSE 189* 169* 194*    Electrolytes Recent Labs  Lab 01/31/18 0600 01/31/18 2159 02/01/18 0517 02/02/18 0443  CALCIUM 7.9* 8.4* 8.3* 8.6*  MG 2.4  --  2.4 2.6*  PHOS 3.6  --  3.3 2.9    CBC Recent Labs  Lab 01/31/18 0600 02/01/18 0517 02/02/18 0443  WBC 17.2* 18.1* 14.7*  HGB 11.9* 12.0 11.6*  HCT 36.8 37.3 35.6  PLT 295 350 327    Coag's Recent Labs  Lab 01/28/18 1855  INR 0.98    Sepsis Markers Recent Labs  Lab 01/28/18 1904 01/28/18 2130 01/28/18 2319 01/29/18 0539 01/30/18 1021  LATICACIDVEN 2.5* 2.5*  --  2.0*  --   PROCALCITON  --   --  1.98 3.35 3.02    ABG Recent Labs  Lab 01/28/18 1951 01/29/18 0449  PHART 7.50* 7.50*  PCO2ART 38 35  PO2ART 148* 108    Liver Enzymes Recent Labs  Lab 01/28/18 1855 02/02/18 0443  AST 129* 34  ALT 96* 40  ALKPHOS 97 55  BILITOT 0.9 0.6  ALBUMIN 3.5 2.9*    Cardiac Enzymes Recent Labs  Lab 01/28/18 1855  TROPONINI <0.03    Glucose Recent Labs  Lab 02/01/18 1952 02/02/18 0005 02/02/18 0348 02/02/18 0717 02/02/18 0726 02/02/18 1130  GLUCAP 181* 252* 196* 175* 175* 210*    CXR: No definite infiltrates or edema    ASSESSMENT / PLAN:  PULMONARY A: VDRF, intubated for airway protection P:   Cont vent support - settings reviewed and/or adjusted Cont  vent bundle Daily SBT if/when meets criteria  Will consider trial of extubation later today  CARDIOVASCULAR A:  Poor venous access P:  Hemodynamic monitoring MAP goal >65 mmHg PICC line ordered  RENAL A:   CKD  Mild hyperkalemia, resolved Mild metabolic acidosis, resolved Mild hypernatremia P:   Monitor BMET intermittently Monitor I/Os Correct electrolytes as indicated  Increase free water 6/17  GASTROINTESTINAL A:   Mild elevation in transaminases, resolved P:   SUP: Enteral famotidine Continue TF protocol  HEMATOLOGIC A:   No acute issues P:  DVT px: Resume enoxaparin 6/17 Monitor CBC intermittently Transfuse per usual guidelines   INFECTIOUS A:   Severe sepsis, resolving Leukocytosis, improving Elevated PCT  P:   Monitor temp, WBC count Micro and abx as above ID service following Recheck PCT in A.M.  ENDOCRINE A:   DM 2 with hyperglycemia Severe hypoglycemia, resolved P:   Moderate scale SSI initiated 6/13 Lantus initiated 6/17  NEUROLOGIC A:   Coma ICU/ventilator associated discomfort P:   RASS goal: 0, -1 Minimize sedating medications Neurology consultation appreciated Continue valproic acid All sedatives discontinued   FAMILY  - Updates: No family at bedside presently   CCM time: 35 mins The above time includes time spent in consultation with patient and/or family members and reviewing care plan on multidisciplinary rounds.    Merton Border, MD PCCM service Mobile [phone] Pager [phone]    02/02/2018, 11:50 AM

## 2018-02-03 ENCOUNTER — Inpatient Hospital Stay: Payer: Medicare Other

## 2018-02-03 DIAGNOSIS — E876 Hypokalemia: Secondary | ICD-10-CM

## 2018-02-03 LAB — CSF CULTURE: GRAM STAIN: NONE SEEN

## 2018-02-03 LAB — GLUCOSE, CAPILLARY
GLUCOSE-CAPILLARY: 153 mg/dL — AB (ref 65–99)
GLUCOSE-CAPILLARY: 160 mg/dL — AB (ref 65–99)
GLUCOSE-CAPILLARY: 181 mg/dL — AB (ref 65–99)
GLUCOSE-CAPILLARY: 225 mg/dL — AB (ref 65–99)
Glucose-Capillary: 187 mg/dL — ABNORMAL HIGH (ref 65–99)
Glucose-Capillary: 149 mg/dL — ABNORMAL HIGH (ref 65–99)

## 2018-02-03 LAB — CSF CULTURE W GRAM STAIN: Culture: NO GROWTH

## 2018-02-03 LAB — BASIC METABOLIC PANEL
ANION GAP: 9 (ref 5–15)
BUN: 49 mg/dL — ABNORMAL HIGH (ref 6–20)
CALCIUM: 8.3 mg/dL — AB (ref 8.9–10.3)
CO2: 26 mmol/L (ref 22–32)
Chloride: 107 mmol/L (ref 101–111)
Creatinine, Ser: 0.9 mg/dL (ref 0.44–1.00)
GFR calc Af Amer: >60 mL/min (ref >60)
GFR calc non Af Amer: >60 mL/min (ref >60)
GLUCOSE: 202 mg/dL — AB (ref 65–99)
POTASSIUM: 3.4 mmol/L — AB (ref 3.5–5.1)
Sodium: 142 mmol/L (ref 135–145)

## 2018-02-03 LAB — PROCALCITONIN: Procalcitonin: 0.24 ng/mL

## 2018-02-03 LAB — VALPROIC ACID LEVEL: Valproic Acid Lvl: 28 ug/mL — ABNORMAL LOW (ref 50.0–100.0)

## 2018-02-03 MED ORDER — DEXTROSE 5 % IV SOLN
1500.0000 mg | Freq: Two times a day (BID) | INTRAVENOUS | Status: DC
  Administered 2018-02-03 – 2018-02-04 (×3): 1500 mg via INTRAVENOUS
  Filled 2018-02-03 (×4): qty 15

## 2018-02-03 MED ORDER — PRO-STAT SUGAR FREE PO LIQD
30.0000 mL | Freq: Three times a day (TID) | ORAL | Status: DC
  Administered 2018-02-03 – 2018-02-04 (×2): 30 mL via ORAL

## 2018-02-03 MED ORDER — POTASSIUM CHLORIDE 20 MEQ/15ML (10%) PO SOLN
40.0000 meq | Freq: Two times a day (BID) | ORAL | Status: AC
  Administered 2018-02-03 (×2): 40 meq
  Filled 2018-02-03 (×2): qty 30

## 2018-02-03 MED ORDER — JEVITY 1.5 CAL/FIBER PO LIQD
1000.0000 mL | ORAL | Status: DC
  Administered 2018-02-03 – 2018-02-04 (×2): 1000 mL

## 2018-02-03 NOTE — Progress Notes (Signed)
Subjective: Patient extubated but remains unresponsive.  Objective: Current vital signs: BP (!) 145/55   Pulse 86   Temp 98.9 F (37.2 C)   Resp (!) 21   Ht '5\' 4"'  (1.626 m)   Wt 95.8 kg (211 lb 3.2 oz)   SpO2 96%   BMI 36.25 kg/m  Vital signs in last 24 hours: Temp:  [98.7 F (37.1 C)-99.1 F (37.3 C)] 98.9 F (37.2 C) (06/18 0700) Pulse Rate:  [79-102] 86 (06/18 0815) Resp:  [16-31] 21 (06/18 0815) BP: (115-196)/(50-102) 145/55 (06/18 0800) SpO2:  [91 %-99 %] 96 % (06/18 0815) FiO2 (%):  [24 %] 24 % (06/18 0320) Weight:  [95.8 kg (211 lb 3.2 oz)] 95.8 kg (211 lb 3.2 oz) (06/18 0334)  Intake/Output from previous day: 06/17 0701 - 06/18 0700 In: 3455.2 [I.V.:1110.2; OE/VO:3500; IV Piggyback:60] Out: 2005 [Urine:1880; Emesis/NG output:125] Intake/Output this shift: Total I/O In: 78 [Other:60] Out: -  Nutritional status:  Diet Order    None      Neurologic Exam: Mental Status: Patient does not respond to verbal stimuli.  Does not respond to deep sternal rub.  Does not follow commands.  No verbalizations noted.  Cranial Nerves: II: patient does not respond confrontation bilaterally, pupils right 4 mm, left 4 mm,and reactive bilaterally III,IV,VI: doll's response present bilaterally.  V,VII: corneal reflex present bilaterally  VIII: patient does not respond to verbal stimuli IX,X: gag reflex unable to be tested, XI: trapezius strength unable to test bilaterally XII: tongue strength unable to test Motor: No spontaneous movement.  Tone increased in the lower extremities.  At times appears to posture. Sensory: Withdraws to noxious stimuli in all extremities Plantars: upgoing bilaterally Cerebellar: Unable to perform   Lab Results: Basic Metabolic Panel: Recent Labs  Lab 01/28/18 2319 01/29/18 0539  01/31/18 0600 01/31/18 2159 02/01/18 0517 02/02/18 0443 02/03/18 0542  NA  --  137   < > 138 141 141 146* 142  K  --  3.6   < > 3.4* 3.1* 5.0 3.9 3.4*  CL   --  102   < > 98* 100* 105 109 107  CO2  --  17*   < > '28 28 26 26 26  ' GLUCOSE  --  164*   < > 201* 189* 169* 194* 202*  BUN  --  45*   < > 60* 53* 51* 54* 49*  CREATININE 1.40* 1.44*   < > 1.28* 1.25* 1.23* 1.01* 0.90  CALCIUM  --  8.1*   < > 7.9* 8.4* 8.3* 8.6* 8.3*  MG 2.2 2.4  --  2.4  --  2.4 2.6*  --   PHOS 3.4 3.9  --  3.6  --  3.3 2.9  --    < > = values in this interval not displayed.    Liver Function Tests: Recent Labs  Lab 01/28/18 1855 02/02/18 0443  AST 129* 34  ALT 96* 40  ALKPHOS 97 55  BILITOT 0.9 0.6  PROT 7.4 6.9  ALBUMIN 3.5 2.9*   No results for input(s): LIPASE, AMYLASE in the last 168 hours. No results for input(s): AMMONIA in the last 168 hours.  CBC: Recent Labs  Lab 01/28/18 1855  01/29/18 0539 01/30/18 1021 01/31/18 0600 02/01/18 0517 02/02/18 0443  WBC 14.9*   < > 23.1* 17.6* 17.2* 18.1* 14.7*  NEUTROABS 13.6*  --   --   --   --   --   --   HGB 14.9   < >  13.2 12.2 11.9* 12.0 11.6*  HCT 45.9   < > 41.8 37.2 36.8 37.3 35.6  MCV 88.3   < > 88.2 87.7 87.9 88.4 89.6  PLT 472*   < > 365 329 295 350 327   < > = values in this interval not displayed.    Cardiac Enzymes: Recent Labs  Lab 01/28/18 1855 01/29/18 0703 01/31/18 0600  CKTOTAL  --  816* 571*  TROPONINI <0.03  --   --     Lipid Panel: Recent Labs  Lab 01/31/18 0608  TRIG 77    CBG: Recent Labs  Lab 02/02/18 1650 02/02/18 1959 02/02/18 2338 02/03/18 0331 02/03/18 0716  GLUCAP 162* 219* 194* 160* 225*    Microbiology: Results for orders placed or performed during the hospital encounter of 01/28/18  CULTURE, BLOOD (ROUTINE X 2) w Reflex to ID Panel     Status: None   Collection Time: 01/28/18  9:30 PM  Result Value Ref Range Status   Specimen Description BLOOD LEFT ANTECUBITAL  Final   Special Requests   Final    BOTTLES DRAWN AEROBIC AND ANAEROBIC Blood Culture adequate volume   Culture   Final    NO GROWTH 5 DAYS Performed at [facility], [address] [place], [place] [address]    Report Status 02/02/2018 FINAL  Final  CULTURE, BLOOD (ROUTINE X 2) w Reflex to ID Panel     Status: None   Collection Time: 01/28/18  9:45 PM  Result Value Ref Range Status   Specimen Description BLOOD BLOOD LEFT ARM  Final   Special Requests   Final    BOTTLES DRAWN AEROBIC AND ANAEROBIC Blood Culture adequate volume   Culture   Final    NO GROWTH 5 DAYS Performed at [facility], [address] [place], [place] [address]    Report Status 02/02/2018 FINAL  Final  MRSA PCR Screening     Status: None   Collection Time: 01/28/18 11:19 PM  Result Value Ref Range Status   MRSA by PCR NEGATIVE NEGATIVE Final    Comment:        The GeneXpert MRSA Assay (FDA approved for NASAL specimens only), is one component of a comprehensive MRSA colonization surveillance program. It is not intended to diagnose MRSA infection nor to guide or monitor treatment for MRSA infections. Performed at [facility], [address], [place], [place] [address]   Urine Culture     Status: Abnormal   Collection Time: 01/29/18  1:06 AM  Result Value Ref Range Status   Specimen Description   Final    URINE, RANDOM Performed at [facility], [address], [place], [place] [address]    Special Requests   Final    NONE Performed at [facility], [address] [place], [place] [address]    Culture (A)  Final    <10,000 COLONIES/mL INSIGNIFICANT GROWTH Performed at [place] [address] [place], [place] [address]    Report Status 01/30/2018 FINAL  Final  Culture, respiratory (NON-Expectorated)     Status: None   Collection Time: 01/29/18 11:44 AM  Result Value Ref Range Status   Specimen Description   Final    TRACHEAL ASPIRATE Performed at [facility], [address], [place], [place] [address]    Special Requests   Final    NONE Performed at [facility], [address]  [place], [place] [address]  Gram Stain   Final    ABUNDANT WBC PRESENT,BOTH PMN AND MONONUCLEAR RARE GRAM POSITIVE COCCI    Culture   Final    Consistent with normal respiratory flora. Performed at [facility], [place] [address], [place], [place] [address]    Report Status 02/01/2018 FINAL  Final  CSF culture     Status: None   Collection Time: 01/30/18  8:56 AM  Result Value Ref Range Status   Specimen Description   Final    CSF Performed at [facility], [address], [place], [place] [address]    Special Requests   Final    NONE Performed at [facility], [address] [place], [place] [address]    Gram Stain   Final    NO ORGANISMS SEEN FEW WBC SEEN MODERATE RED BLOOD CELLS Performed at [facility], [address] [place], [place] [address]    Culture   Final    NO GROWTH 3 DAYS Performed at [facility], [place] [address], [place], [place] [address]    Report Status 02/03/2018 FINAL  Final  C difficile quick scan w PCR reflex     Status: None   Collection Time: 02/01/18  4:09 AM  Result Value Ref Range Status   C Diff antigen NEGATIVE NEGATIVE Final   C Diff toxin NEGATIVE NEGATIVE Final   C Diff interpretation No C. difficile detected.  Final    Comment: Performed at [facility], [address] [place], [place] [address]    Coagulation Studies: No results for input(s): LABPROT, INR in the last 72 hours.  Imaging: Dg Chest 1 View  Result Date: 02/02/2018 CLINICAL DATA:  Status post central line placement EXAM: CHEST  1 VIEW COMPARISON:  Film from earlier in the same day. FINDINGS: Endotracheal tube and nasogastric catheter are again seen and stable. Left jugular central line is now noted at the proximal superior vena cava. No pneumothorax is seen. No focal confluent infiltrate is noted. No acute bony abnormality is seen. IMPRESSION: No pneumothorax following central line placement Electronically Signed   By: Inez Catalina M.D.   On: 02/02/2018 17:26   Dg Abd 1 View  Result Date: 02/02/2018 CLINICAL DATA:  NG tube placement EXAM: ABDOMEN - 1 VIEW COMPARISON:  01/28/2018 FINDINGS: NG tube tip is in the mid stomach. Prior cholecystectomy. Mild diffuse gaseous distention of bowel. IMPRESSION: NG tube tip in the mid stomach. Electronically Signed   By: Rolm Baptise M.D.   On: 02/02/2018 12:50   Dg Chest Port 1 View  Result Date: 02/02/2018 CLINICAL DATA:  Acute respiratory failure, sepsis, hypoxia. EXAM: PORTABLE CHEST 1 VIEW COMPARISON:  Portable chest x-ray of February 01, 2018 FINDINGS: The lungs are adequately inflated. There is no focal infiltrate. The interstitial markings are mildly prominent in the infrahilar regions bilaterally. The heart is top-normal in size. The pulmonary vascularity is not engorged. The endotracheal tube tip projects approximately 6.2 cm above the carina. The esophagogastric tube tip projects below the inferior margin of the image. IMPRESSION: Slight increase conspicuity of bibasilar densities, likely atelectasis. No overt pulmonary edema. The support tubes are in reasonable position. Electronically Signed   By: David  Martinique M.D.   On: 02/02/2018 07:17   [place] Ekg Site Rite  Result Date: 02/02/2018 If Site Rite image not attached, placement could not be confirmed due to current cardiac rhythm.   Medications:  I have reviewed the patient's current medications. Scheduled: .  chlorhexidine gluconate (MEDLINE KIT)  15 mL Mouth Rinse BID  . enoxaparin (LOVENOX) injection  40 mg Subcutaneous Q24H  . feeding supplement (JEVITY 1.5 CAL/FIBER)  1,000 mL Per Tube Q24H  . feeding supplement (PRO-STAT SUGAR FREE 64)  30 mL Oral TID  . free water  200 mL Per Tube Q8H  . insulin aspart  0-15 Units Subcutaneous Q4H  . insulin glargine  10 Units Subcutaneous Daily  . mouth rinse  15 mL Mouth Rinse 10 times per day  . multivitamin  15 mL Per Tube Daily  . potassium chloride  40 mEq Per Tube BID  .  sodium chloride flush  10-40 mL Intracatheter Q12H    Assessment/Plan: Patient now extubated but otherwise with no significant improvement.  Has been started on Depakote.  Depakote level today is 28.  Now that extubated may be able to get a more interpretable EEG.  Recommendations: 1. EEG  2. Increase Depacon to 1529m q 12hours 3. Depakote level in a.m.   LOS: 6 days   LAlexis Goodell MD Neurology [id number]/18/2019  10:10 AM

## 2018-02-03 NOTE — Progress Notes (Signed)
eeg done °

## 2018-02-03 NOTE — Progress Notes (Signed)
Nutrition Follow-up  DOCUMENTATION CODES:   Obesity unspecified  INTERVENTION:  Initiate new goal TF regimen of Jevity 1.5 Cal at 40 mL/hr (960 mL goal daily volume) + Pro-Stat 30 mL TID via NGT. Provides 1740 kcal, 106 grams of protein, 21 grams of fiber, and 730 mL H2O daily.  Continue liquid MVI daily per tube.  With current free water flush of 200 mL Q8hrs patient is receiving a total of 1330 mL H2O daily including water in tube feeding.  NUTRITION DIAGNOSIS:   Inadequate oral intake related to inability to eat as evidenced by NPO status.  Ongoing - addressing with TF regimen.  GOAL:   Provide needs based on ASPEN/SCCM guidelines  Met with TF regimen.  MONITOR:   Vent status, Labs, Weight trends, TF tolerance, I & O's  REASON FOR ASSESSMENT:   Ventilator, Consult Enteral/tube feeding initiation and management  ASSESSMENT:   77 year old female with PMHx of anxiety, asthma, HTN, DM type 2, hx of C. Difficile infection who presented after being found unresponsive at home by family and found to have sepsis from possible urinary source, hypoglycemia, and acute encephalopathy requiring intubation for airway protection on 6/12.   -Patient underwent CT head and CT cervical spine on 6/12. No acute intracranial abnormalities or acute traumatic injury found to cervical spine. Mild cerebral atrophy found. -Patient underwent MRI brain on 6/14 with no acute abnormalities found. -Patient underwent EEG on 6/14 but no interpretation was able to be made due to artifact. -LP was also completed on 6/14 but shows no evidence of meningitis. -OGT was removed on 6/17 and replaced with NGT. -Patient was extubated on 6/18. -Plan is for repeat EEG today.  Met with patient at bedside. She is s/p extubation this AM and currently on [place] 1 L/min. NGT remains in place with tube feeds infusing. Patient is not able to follow commands at this time and is not opening her eyes.  Access: NGT placed  6/17; terminates in mid stomach per abdominal x-ray 6/17; 60 cm at left nare  TF: patient has been tolerating Vital High Protein at 55 mL/hr  Medications reviewed and include: free water flush 200 mL Q8hrs, Novolog 0-15 units Q4hrs, Lantus 10 units daily, liquid MVI daily per tube, potassium chloride 40 mEq per tube for 2 doses today, valproate.  Labs reviewed: CBG 160-225, Potassium 3.4, BUN 49.  I/O: 1880 mL UOP yesterday (0.8 mL/kg/hr)  Weight trend: 95.8 kg on 6/18; +0.9 kg from admission  Discussed with RN and on rounds.  Diet Order:   Diet Order    None      EDUCATION NEEDS:   No education needs have been identified at this time  Skin:  Skin Assessment: Reviewed RN Assessment(ecchymosis to bilateral arms)  Last BM:  02/03/2018 - small type 7 in rectal tube  Height:   Ht Readings from Last 1 Encounters:  01/28/18 '5\' 4"'  (1.626 m)    Weight:   Wt Readings from Last 1 Encounters:  02/03/18 211 lb 3.2 oz (95.8 kg)    Ideal Body Weight:  54.5 kg  BMI:  Body mass index is 36.25 kg/m.  Estimated Nutritional Needs:   Kcal:  1720-2000 (MSJ x 1.2-1.4)  Protein:  95-115 grams (1-1.2 grams/kg)  Fluid:  1.7-2 L/day (1 mL/kcal)  Willey Blade, MS, RD, LDN Office: [phone] Pager: [phone] After Hours/Weekend Pager: [phone]

## 2018-02-03 NOTE — Progress Notes (Signed)
Pt extubated and placed on 4lpm [place]. HR 91 RR 20 oxygen saturations 100%.

## 2018-02-03 NOTE — Progress Notes (Signed)
0815 Extubated to nasal cannula 4 liters. After extubation did not open her eyes or speak when asked.

## 2018-02-03 NOTE — Progress Notes (Signed)
   [place] [facility] [place] NAME: Sabrina Quinn    MR#:  [id number]  DATE OF BIRTH:  [birth year]  SUBJECTIVE:  CHIEF COMPLAINT:   Chief Complaint  Patient presents with  . Altered Mental Status   Extubated to [place].  REVIEW OF SYSTEMS:  Review of Systems  Unable to perform ROS: Intubated   DRUG ALLERGIES:   Allergies  Allergen Reactions  . Penicillins Rash   VITALS:  Blood pressure (!) 143/57, pulse 93, temperature 98.1 F (36.7 C), resp. rate (!) 33, height 5\' 4"  (1.626 m), weight 95.8 kg (211 lb 3.2 oz), SpO2 96 %. PHYSICAL EXAMINATION:  Physical Exam  HENT:  Head: Normocephalic and atraumatic.  Eyes: Pupils are equal, round, and reactive to light. Conjunctivae and EOM are normal.  Neck: Normal range of motion. Neck supple. No tracheal deviation present. No thyromegaly present.  Cardiovascular: Normal rate, regular rhythm and normal heart sounds.  Pulmonary/Chest: Effort normal and breath sounds normal. No respiratory distress. She has no wheezes. She exhibits no tenderness.  Abdominal: Soft. Bowel sounds are normal. She exhibits no distension. There is no tenderness.  Musculoskeletal: Normal range of motion.  Neurological:  Not following commands  Skin: Skin is warm and dry. No rash noted.  Psychiatric:  Unable to assess due to on vent   LABORATORY PANEL:  Female CBC Recent Labs  Lab 02/02/18 0443  WBC 14.7*  HGB 11.6*  HCT 35.6  PLT 327   ------------------------------------------------------------------------------------------------------------------ Chemistries  Recent Labs  Lab 02/02/18 0443 02/03/18 0542  NA 146* 142  K 3.9 3.4*  CL 109 107  CO2 26 26  GLUCOSE 194* 202*  BUN 54* 49*  CREATININE 1.01* 0.90  CALCIUM 8.6* 8.3*  MG 2.6*  --   AST 34  --   ALT 40  --   ALKPHOS 55  --   BILITOT 0.6  --    RADIOLOGY:  No results found. ASSESSMENT AND PLAN:   * Sepsis: present on admission. Continue empiric  Abx. LP performed this am - CSF results not consistent with meningitis .  On IV ceftriaxone  * Acute metabolic encephalopathy: could be due to hypoglycemia, unlikely meningitis per ID, Neuro - Unclear if there may be some hypoglycemic injury to brain.   - MRI of the brain -nothing acute -EEG was inconclusive Started on Depakote for possible seizures.  * Acute respiratory failure with hypoxia (HCC)  Off ventilator support  * HTN (hypertension) -stable, continue home meds  * Diabetes ([place]) -sliding scale insulin with corresponding glucose checks  All the records are reviewed and case discussed with Care Management/Social Worker. Management plans discussed with the patient, nursing and they are in agreement.  CODE STATUS: Full Code  TOTAL TIME TAKING CARE OF THIS PATIENT: 25 minutes.   POSSIBLE D/C IN 1-2 DAYS, DEPENDING ON CLINICAL CONDITION.   Neita Carp M.D on 02/03/2018 at 11:17 PM  Between 7am to 6pm - Pager - [phone]  After 6pm go to [email or web address] - Proofreader  [facility]  Office  [phone]  CC: Primary care physician; Cletis Athens, MD  Note: This dictation was prepared with Dragon dictation along with smaller phrase technology. Any transcriptional errors that result from this process are unintentional.

## 2018-02-03 NOTE — Progress Notes (Addendum)
Interesting day. Extubated at 0815 to 4 Liters nasal cannula then weaned to room air. Posturing of left side noted at times with extreme stiffness of left arm and sometimes left leg. Right side  withdraws to pain . Left side appears to have decerebrate posturing. Legs either withdraw or are hyper reflexive with any stimulation. Bilateral toes demonstrate Babinski reflexes At first assessment bilateral legs were stiff but she moved them more freely as the day progressed. She also helped turn herself this afternoon??? She has not opened her eyes or tried to speak all day. PEARL When head moved side to side Dolls Eyes noted bilaterally. Swallows at times and also grits her teeth. Unable to dangle legs at bedside but bed placed in modified chair position. Rectal foley patent with very little output. Urine output good as well. Afebrile. Family hopeful.

## 2018-02-03 NOTE — Progress Notes (Signed)
PULMONARY / CRITICAL CARE MEDICINE   Name: Sabrina Quinn MRN: [id number] DOB: Dec 19, 1940    ADMISSION DATE:  01/28/2018  PT PROFILE: 69 F with history of DM, found by family unresponsive.  Severe hypoglycemia noted by EMS.  Cognition did not improve with dextrose infusion.  Intubated in ED for altered cognition.  High fever noted, diagnosis of severe sepsis of unknown origin given.  MAJOR EVENTS/TEST RESULTS: 6/12 admission as above 6/12 CT head and neck: No acute findings 6/13 minimally responsive off of all sedation.  High fevers persist.  Antibiotics changed to cover possible meningitis and HSV encephalitis.  Neurology, ID consultations requested.  LP ordered but could not be performed due to recent enoxaparin. 6/14 MRI brain: No acute abnormality. LP performed 6/14 EEG: Muscle and movement artifact.  No interpretation could be made 6/17 minimally responsive despite no sedation in past 24 hours.  Coughs upon suctioning.  No significant respiratory secretions.  Tolerates minimal ventilator support.  Withdraws all extremities 06/18 extubated and tolerating well initially.  Remains obtunded.  Withdraws all extremities.  Spontaneous cough and vigorous cough upon suctioning. 06/18 EEG:   INDWELLING DEVICES:: ETT 06/13 >> 06/18 L IJ CVL 6/17 >>   MICRO DATA: MRSA PCR 6/12 >> NEG Urine 6/12 >> insignificant growth Resp 6/13 >> NOF Blood 6/12 >> NEG CSF 6/14 >> no leukocytosis, bacterial culture negative CSF HSV PCR 06/14 >> NEG  ANTIMICROBIALS:  Cefepime 6/12 >> 6/13 Vanc 6/12 >> 6/14 Meropenem 6/13 >> 6/14 Acyclovir 6/13 >> 6/14 Ceftriaxone 6/14 >> 6/18   SUBJECTIVE:  RASS -3, -4.  Withdrawals all extremities.  Passed SBT.  Excellent cough.  Minimal respiratory secretions.  Extubated this morning under my direction and tolerating well initially  VITAL SIGNS: BP 124/81   Pulse 93   Temp 99 F (37.2 C)   Resp (!) 38   Ht 5\' 4"  (1.626 m)   Wt 211 lb 3.2 oz (95.8 kg)   SpO2  95%   BMI 36.25 kg/m   HEMODYNAMICS:    VENTILATOR SETTINGS: Vent Mode: PSV FiO2 (%):  [24 %] 24 % PEEP:  [5 cmH20] 5 cmH20 Pressure Support:  [0 cmH20-5 cmH20] 5 cmH20 Plateau Pressure:  [16 cmH20-17 cmH20] 17 cmH20  INTAKE / OUTPUT: I/O last 3 completed shifts: In: 5130.2 [I.V.:1330.2; ZDGUY:403; KV/QQ:5956; IV Piggyback:60] Out: 2830 [Urine:2705; Emesis/NG output:125]  PHYSICAL EXAMINATION: General: Comfortable on [place] O2, no overt distress Neuro: PERRL, EOMI, withdraws all extremities HEENT: NCAT, sclerae white Cardiovascular: Regular, no M Lungs: Clear anteriorly without adventitious sounds Abdomen: Soft, NT, + BS Extremities: Warm, no edema Skin: No lesions noted  LABS:  BMET Recent Labs  Lab 02/01/18 0517 02/02/18 0443 02/03/18 0542  NA 141 146* 142  K 5.0 3.9 3.4*  CL 105 109 107  CO2 26 26 26   BUN 51* 54* 49*  CREATININE 1.23* 1.01* 0.90  GLUCOSE 169* 194* 202*    Electrolytes Recent Labs  Lab 01/31/18 0600  02/01/18 0517 02/02/18 0443 02/03/18 0542  CALCIUM 7.9*   < > 8.3* 8.6* 8.3*  MG 2.4  --  2.4 2.6*  --   PHOS 3.6  --  3.3 2.9  --    < > = values in this interval not displayed.    CBC Recent Labs  Lab 01/31/18 0600 02/01/18 0517 02/02/18 0443  WBC 17.2* 18.1* 14.7*  HGB 11.9* 12.0 11.6*  HCT 36.8 37.3 35.6  PLT 295 350 327    Coag's Recent Labs  Lab  01/28/18 1855  INR 0.98    Sepsis Markers Recent Labs  Lab 01/28/18 1904 01/28/18 2130  01/29/18 0539 01/30/18 1021 02/03/18 0542  LATICACIDVEN 2.5* 2.5*  --  2.0*  --   --   PROCALCITON  --   --    < > 3.35 3.02 0.24   < > = values in this interval not displayed.    ABG Recent Labs  Lab 01/28/18 1951 01/29/18 0449  PHART 7.50* 7.50*  PCO2ART 38 35  PO2ART 148* 108    Liver Enzymes Recent Labs  Lab 01/28/18 1855 02/02/18 0443  AST 129* 34  ALT 96* 40  ALKPHOS 97 55  BILITOT 0.9 0.6  ALBUMIN 3.5 2.9*    Cardiac Enzymes Recent Labs  Lab  01/28/18 1855  TROPONINI <0.03    Glucose Recent Labs  Lab 02/02/18 1650 02/02/18 1959 02/02/18 2338 02/03/18 0331 02/03/18 0716 02/03/18 1203  GLUCAP 162* 219* 194* 160* 225* 187*    CXR: No new film    ASSESSMENT / PLAN:  PULMONARY A: VDRF, intubated for airway protection P:   Extubated this morning under my direction Supplemental oxygen as needed to maintain SPO2 >90% NTS as needed  CARDIOVASCULAR A:  Poor venous access P:  Hemodynamic monitoring MAP goal >65 mmHg CVL placed 6/17  RENAL A:   Stage I CKD  AKI, resolved Mild hyperkalemia, recurrent Mild metabolic acidosis, resolved Mild hypernatremia, resolved P:   Monitor BMET intermittently Monitor I/Os Correct electrolytes as indicated   GASTROINTESTINAL A:   Mild elevation in transaminases, resolved Dysphagia P:   SUP: N/I post extubation NGT left in place post extubation Continue TF protocol  HEMATOLOGIC A:   Mild ICU acquired anemia P:  DVT px: Enoxaparin Monitor CBC intermittently Transfuse per usual guidelines   INFECTIOUS A:   Severe sepsis, resolved Leukocytosis, improving Elevated PCT, resolved P:   Monitor temp, WBC count Micro and abx as above ID service following  ENDOCRINE A:   DM 2 with hyperglycemia Severe hypoglycemia, resolved P:   Moderate scale SSI initiated 6/13 Lantus initiated 6/17  NEUROLOGIC A:   Coma P:   RASS goal: 0 Avoid all sedating medications Neurology following Continue valproic acid   FAMILY  - Updates: Family updated at bedside yesterday evening   CCM time: 40 mins The above time includes time spent in consultation with patient and/or family members and reviewing care plan on multidisciplinary rounds.    Merton Border, MD PCCM service Mobile [phone] Pager [phone]    02/03/2018, 2:39 PM

## 2018-02-03 NOTE — Procedures (Signed)
ELECTROENCEPHALOGRAM REPORT   Patient: Sabrina Quinn       Room #: IC06A-AA EEG No. ID: [id number] Age: 77 y.o.        Sex: female Referring Physician: Alva Garnet Report Date:  02/03/2018        Interpreting Physician: Alexis Goodell  History: Sabrina Quinn is an 77 y.o. female with altered mental status  Medications:  Jevity, Insulin, Depacon  Conditions of Recording:  This is a 16 channel EEG carried out with the patient in the poorly responsive state.  Description:  The background activity is often dominated by muscle and movement artifact but there are occasions when a background r hythm can be evaluated.  During these periods the background rhythm is low voltage and slow.  It consists of a polymorphic delta activity that is diffusely distributed and continuous.   No epileptiform activity is noted.   No evidence of normal sleep is noted.   Hyperventilation and intermittent photic stimulation were not performed.   IMPRESSION: This is an abnormal EEG secondary to general background slowing.  This finding may be seen with a diffuse disturbance that is etiologically nonspecific, but may include a metabolic encephalopathy, among other possibilities.  No epileptiform activity was noted.     Alexis Goodell, MD Neurology [phone] 02/03/2018, 3:37 PM

## 2018-02-04 DIAGNOSIS — Z7189 Other specified counseling: Secondary | ICD-10-CM

## 2018-02-04 DIAGNOSIS — G9340 Encephalopathy, unspecified: Secondary | ICD-10-CM

## 2018-02-04 LAB — PROCALCITONIN: Procalcitonin: 0.13 ng/mL

## 2018-02-04 LAB — BASIC METABOLIC PANEL
Anion gap: 9 (ref 5–15)
BUN: 43 mg/dL — ABNORMAL HIGH (ref 6–20)
CHLORIDE: 110 mmol/L (ref 101–111)
CO2: 27 mmol/L (ref 22–32)
CREATININE: 0.86 mg/dL (ref 0.44–1.00)
Calcium: 8.6 mg/dL — ABNORMAL LOW (ref 8.9–10.3)
GFR calc non Af Amer: >60 mL/min (ref >60)
GLUCOSE: 143 mg/dL — AB (ref 65–99)
Potassium: 4.4 mmol/L (ref 3.5–5.1)
Sodium: 146 mmol/L — ABNORMAL HIGH (ref 135–145)

## 2018-02-04 LAB — CBC
HCT: 33.1 % — ABNORMAL LOW (ref 35.0–47.0)
HEMOGLOBIN: 10.9 g/dL — AB (ref 12.0–16.0)
MCH: 29.5 pg (ref 26.0–34.0)
MCHC: 33 g/dL (ref 32.0–36.0)
MCV: 89.4 fL (ref 80.0–100.0)
PLATELETS: 278 10*3/uL (ref 150–440)
RBC: 3.7 MIL/uL — ABNORMAL LOW (ref 3.80–5.20)
RDW: 15.7 % — ABNORMAL HIGH (ref 11.5–14.5)
WBC: 10.2 10*3/uL (ref 3.6–11.0)

## 2018-02-04 LAB — GLUCOSE, CAPILLARY
GLUCOSE-CAPILLARY: 155 mg/dL — AB (ref 65–99)
Glucose-Capillary: 157 mg/dL — ABNORMAL HIGH (ref 65–99)
Glucose-Capillary: 137 mg/dL — ABNORMAL HIGH (ref 65–99)
Glucose-Capillary: 200 mg/dL — ABNORMAL HIGH (ref 65–99)
Glucose-Capillary: 120 mg/dL — ABNORMAL HIGH (ref 65–99)

## 2018-02-04 LAB — VALPROIC ACID LEVEL: Valproic Acid Lvl: 55 ug/mL (ref 50.0–100.0)

## 2018-02-04 MED ORDER — FREE WATER
200.0000 mL | Status: DC
  Administered 2018-02-04 (×2): 200 mL

## 2018-02-04 NOTE — Progress Notes (Addendum)
Subjective: Patient unchanged.  Remains on Depacon.  No seizure activity noted.    Objective: Current vital signs: BP (!) 114/95   Pulse 92   Temp 99.6 F (37.6 C) (Oral)   Resp (!) 21   Ht _0  (1.626 m)   Wt 93.5 kg (206 lb 2.1 oz)   SpO2 96%   BMI 35.38 kg/m  Vital signs in last 24 hours: Temp:  [98.1 F (36.7 C)-99.6 F (37.6 C)] 99.6 F (37.6 C) (06/19 1200) Pulse Rate:  [88-99] 92 (06/19 1200) Resp:  [19-38] 21 (06/19 1200) BP: (111-146)/(38-97) 114/95 (06/19 1200) SpO2:  [93 %-98 %] 96 % (06/19 1200) Weight:  [93.5 kg (206 lb 2.1 oz)] 93.5 kg (206 lb 2.1 oz) (06/19 0412)  Intake/Output from previous day: 06/18 0701 - 06/19 0700 In: 1961.7 [I.V.:329.8; WJ/XB:1478; IV Piggyback:153.8] Out: 1625 [Urine:1625] Intake/Output this shift: Total I/O In: -  Out: 500 [Urine:500] Nutritional status:  Diet Order    None      Neurologic Exam: Mental Status: Patient does not respond to verbal stimuli. Does not respond to deep sternal rub. Does not follow commands. No verbalizations noted.  Cranial Nerves: II: patient does not respond confrontation bilaterally, pupils right74m, left 471mand reactivebilaterally III,IV,VI: doll's responsepresent bilaterally.  V,VII: corneal reflexpresentbilaterally VIII: patient does not respond to verbal stimuli IX,X: gag reflexunable to be tested, XI: trapezius strength unable to test bilaterally XII: tongue strength unable to test Motor: No spontaneous movement.  Tone increased in the lower extremities.  At times appears to posture. Sensory: Withdrawsto noxious stimuli in allextremities Plantars: upgoingbilaterally  Lab Results: Basic Metabolic Panel: Recent Labs  Lab 01/28/18 2319 01/29/18 0539  01/31/18 0600 01/31/18 2159 02/01/18 0517 02/02/18 0443 02/03/18 0542 02/04/18 0413  NA  --  137   < > 138 141 141 146* 142 146*  K  --  3.6   < > 3.4* 3.1* 5.0 3.9 3.4* 4.4  CL  --  102   < > 98* 100* 105 109  107 110  CO2  --  17*   < > _1 GLUCOSE  --  164*   < > 201* 189* 169* 194* 202* 143*  BUN  --  45*   < > 60* 53* 51* 54* 49* 43*  CREATININE 1.40* 1.44*   < > 1.28* 1.25* 1.23* 1.01* 0.90 0.86  CALCIUM  --  8.1*   < > 7.9* 8.4* 8.3* 8.6* 8.3* 8.6*  MG 2.2 2.4  --  2.4  --  2.4 2.6*  --   --   PHOS 3.4 3.9  --  3.6  --  3.3 2.9  --   --    < > = values in this interval not displayed.    Liver Function Tests: Recent Labs  Lab 01/28/18 1855 02/02/18 0443  AST 129* 34  ALT 96* 40  ALKPHOS 97 55  BILITOT 0.9 0.6  PROT 7.4 6.9  ALBUMIN 3.5 2.9*   No results for input(s): LIPASE, AMYLASE in the last 168 hours. No results for input(s): AMMONIA in the last 168 hours.  CBC: Recent Labs  Lab 01/28/18 1855  01/30/18 1021 01/31/18 0600 02/01/18 0517 02/02/18 0443 02/04/18 0413  WBC 14.9*   < > 17.6* 17.2* 18.1* 14.7* 10.2  NEUTROABS 13.6*  --   --   --   --   --   --   HGB 14.9   < > 12.2 11.9* 12.0 11.6*  10.9*  HCT 45.9   < > 37.2 36.8 37.3 35.6 33.1*  MCV 88.3   < > 87.7 87.9 88.4 89.6 89.4  PLT 472*   < > 329 295 350 327 278   < > = values in this interval not displayed.    Cardiac Enzymes: Recent Labs  Lab 01/28/18 1855 01/29/18 0703 01/31/18 0600  CKTOTAL  --  816* 571*  TROPONINI <0.03  --   --     Lipid Panel: Recent Labs  Lab 01/31/18 0608  TRIG 77    CBG: Recent Labs  Lab 02/03/18 2002 02/03/18 2340 02/04/18 0349 02/04/18 0733 02/04/18 1124  GLUCAP 153* 181* 157* 137* 200*    Microbiology: Results for orders placed or performed during the hospital encounter of 01/28/18  CULTURE, BLOOD (ROUTINE X 2) w Reflex to ID Panel     Status: None   Collection Time: 01/28/18  9:30 PM  Result Value Ref Range Status   Specimen Description BLOOD LEFT ANTECUBITAL  Final   Special Requests   Final    BOTTLES DRAWN AEROBIC AND ANAEROBIC Blood Culture adequate volume   Culture   Final    NO GROWTH 5 DAYS Performed at [facility],  [address] [place], [place] [address]    Report Status 02/02/2018 FINAL  Final  CULTURE, BLOOD (ROUTINE X 2) w Reflex to ID Panel     Status: None   Collection Time: 01/28/18  9:45 PM  Result Value Ref Range Status   Specimen Description BLOOD BLOOD LEFT ARM  Final   Special Requests   Final    BOTTLES DRAWN AEROBIC AND ANAEROBIC Blood Culture adequate volume   Culture   Final    NO GROWTH 5 DAYS Performed at [facility], [address] [place], [place] [address]    Report Status 02/02/2018 FINAL  Final  MRSA PCR Screening     Status: None   Collection Time: 01/28/18 11:19 PM  Result Value Ref Range Status   MRSA by PCR NEGATIVE NEGATIVE Final    Comment:        The GeneXpert MRSA Assay (FDA approved for NASAL specimens only), is one component of a comprehensive MRSA colonization surveillance program. It is not intended to diagnose MRSA infection nor to guide or monitor treatment for MRSA infections. Performed at [facility], [address], [place], [place] [address]   Urine Culture     Status: Abnormal   Collection Time: 01/29/18  1:06 AM  Result Value Ref Range Status   Specimen Description   Final    URINE, RANDOM Performed at [facility], [address] [place], [place] [address]    Special Requests   Final    NONE Performed at [facility], [address] [place], [place] [address]    Culture (A)  Final    <10,000 COLONIES/mL INSIGNIFICANT GROWTH Performed at [facility] [address], [place], [place] [address]    Report Status 01/30/2018 FINAL  Final  Culture, respiratory (NON-Expectorated)     Status: None   Collection Time: 01/29/18 11:44 AM  Result Value Ref Range Status   Specimen Description   Final    TRACHEAL ASPIRATE Performed at [facility], [address] [place], [place] [address]    Special Requests   Final    NONE Performed at [facility], [address] [place], [place] [address]    Gram Stain  Final    ABUNDANT WBC PRESENT,BOTH PMN AND MONONUCLEAR RARE GRAM POSITIVE COCCI    Culture   Final    Consistent with normal respiratory flora. Performed at [facility], [place] [address], [place], [place] [address]    Report Status 02/01/2018 FINAL  Final  CSF culture     Status: None   Collection Time: 01/30/18  8:56 AM  Result Value Ref Range Status   Specimen Description   Final    CSF Performed at [facility], [address] [place], [place] [address]    Special Requests   Final    NONE Performed at [facility], [address] [place], [place] [address]    Gram Stain   Final    NO ORGANISMS SEEN FEW WBC SEEN MODERATE RED BLOOD CELLS Performed at [facility], [address] [place], [place] [address]    Culture   Final    NO GROWTH 3 DAYS Performed at [facility], [place] [address], [place], [place] [address]    Report Status 02/03/2018 FINAL  Final  C difficile quick scan w PCR reflex     Status: None   Collection Time: 02/01/18  4:09 AM  Result Value Ref Range Status   C Diff antigen NEGATIVE NEGATIVE Final   C Diff toxin NEGATIVE NEGATIVE Final   C Diff interpretation No C. difficile detected.  Final    Comment: Performed at [facility], [address] [place],  [address]    Coagulation Studies: No results for input(s): LABPROT, INR in the last 72 hours.  Imaging: Dg Chest 1 View  Result Date: 02/02/2018 CLINICAL DATA:  Status post central line placement EXAM: CHEST  1 VIEW COMPARISON:  Film from earlier in the same day. FINDINGS: Endotracheal tube and nasogastric catheter are again seen and stable. Left jugular central line is now noted at the proximal superior vena cava. No pneumothorax is seen. No focal confluent infiltrate is noted. No acute bony abnormality is seen. IMPRESSION: No pneumothorax following central line placement Electronically Signed   By:  Inez Catalina M.D.   On: 02/02/2018 17:26    Medications:  I have reviewed the patient's current medications. Scheduled: . chlorhexidine gluconate (MEDLINE KIT)  15 mL Mouth Rinse BID  . enoxaparin (LOVENOX) injection  40 mg Subcutaneous Q24H  . feeding supplement (JEVITY 1.5 CAL/FIBER)  1,000 mL Per Tube Q24H  . feeding supplement (PRO-STAT SUGAR FREE 64)  30 mL Oral TID  . free water  200 mL Per Tube Q4H  . insulin aspart  0-15 Units Subcutaneous Q4H  . insulin glargine  10 Units Subcutaneous Daily  . multivitamin  15 mL Per Tube Daily  . sodium chloride flush  10-40 mL Intracatheter Q12H    Assessment/Plan: Patient unchanged.  EEG on yesterday only significant for slowing.  Depakote level therapeutic at 55.  Seizure activity unlikely.  Prognosis for functional improvement is poor.  High suspicion for hypoglycemic brain injury.  Discussed condition and prognosis with daughters.  They produced a living will where the patient clearly expressed her wishes to not be fed by a feeding tube.    Recommendations: 1.  Discontinue Depakote 2.  Would discontinue tube feeds as well.   3.  Palliative care consult for possible Hospice  Case discussed with Dr. Alva Garnet   LOS: 7 days   Alexis Goodell, MD Neurology [phone] 02/04/2018  1:28 PM

## 2018-02-04 NOTE — Progress Notes (Signed)
Sabrina Quinn at [place] NAME: Sabrina Quinn    MR#:  [id number]  DATE OF BIRTH:  Dec 31, 1940  SUBJECTIVE:  CHIEF COMPLAINT:   Chief Complaint  Patient presents with  . Altered Mental Status   patient extubated. NG tube in place. No change in her mental status. Patient made DNR in the ICU. Transferred to medical floor.  REVIEW OF SYSTEMS:  Review of Systems  Unable to perform ROS: Intubated   DRUG ALLERGIES:   Allergies  Allergen Reactions  . Penicillins Rash   VITALS:  Blood pressure (!) 151/101, pulse 95, temperature 99.1 F (37.3 C), temperature source Oral, resp. rate (!) 25, height 5\' 4"  (1.626 m), weight 93.5 kg (206 lb 2.1 oz), SpO2 98 %. PHYSICAL EXAMINATION:  Physical Exam  HENT:  Head: Normocephalic and atraumatic.  Eyes: Pupils are equal, round, and reactive to light. Conjunctivae and EOM are normal.  Neck: Normal range of motion. Neck supple. No tracheal deviation present. No thyromegaly present.  Cardiovascular: Normal rate, regular rhythm and normal heart sounds.  Pulmonary/Chest: Effort normal and breath sounds normal. No respiratory distress. She has no wheezes. She exhibits no tenderness.  Abdominal: Soft. Bowel sounds are normal. She exhibits no distension. There is no tenderness.  Musculoskeletal: Normal range of motion.  Neurological:  Not following commands  Skin: Skin is warm and dry. No rash noted.  Psychiatric:  Unable to assess due to on vent   LABORATORY PANEL:  Female CBC Recent Labs  Lab 02/04/18 0413  WBC 10.2  HGB 10.9*  HCT 33.1*  PLT 278   ------------------------------------------------------------------------------------------------------------------ Chemistries  Recent Labs  Lab 02/02/18 0443  02/04/18 0413  NA 146*   < > 146*  K 3.9   < > 4.4  CL 109   < > 110  CO2 26   < > 27  GLUCOSE 194*   < > 143*  BUN 54*   < > 43*  CREATININE 1.01*   < > 0.86  CALCIUM 8.6*   < > 8.6*  MG  2.6*  --   --   AST 34  --   --   ALT 40  --   --   ALKPHOS 55  --   --   BILITOT 0.6  --   --    < > = values in this interval not displayed.   RADIOLOGY:  No results found. ASSESSMENT AND PLAN:   * Sepsis: present on admission. Now resolved  . LP performed this am - CSF results not consistent with meningitis .  Was on IV ceftriaxone and later stopped.  * Acute metabolic encephalopathy: could be due to hypoglycemia, unlikely meningitis per ID, Neuro - Unclear if there may be some hypoglycemic injury to brain.   - MRI of the brain -nothing acute -EEG was inconclusive Started on Depakote for possible seizures. No change in mental status  * Acute respiratory failure with hypoxia (HCC)  Off ventilator support  * HTN (hypertension) -stable, continue home meds  * Diabetes ([place]) -sliding scale insulin with corresponding glucose checks  Patient has been made do not resuscitate. Family meeting with palliative care in the morning. Likely discharge to hospice home.  All the records are reviewed and case discussed with Care Management/Social Worker.  CODE STATUS: DNR  TOTAL TIME TAKING CARE OF THIS PATIENT: 25 minutes.   POSSIBLE D/C IN 1-2 DAYS, DEPENDING ON CLINICAL CONDITION.   Neita Carp M.D on 02/04/2018  at 5:07 PM  Between 7am to 6pm - Pager - [phone]  After 6pm go to [email or web address] - Proofreader  [facility]  Office  [phone]  CC: Primary care physician; Cletis Athens, MD  Note: This dictation was prepared with Dragon dictation along with smaller phrase technology. Any transcriptional errors that result from this process are unintentional.

## 2018-02-04 NOTE — Progress Notes (Signed)
Daughters had discussion with Dr Alva Garnet and Dr Doy Mince. Family decided on DNR status today. Pt had living will in a frame over her bed according to children. She requested no feeding tube to be placed. Await palliative care visit with daughters for further goal of care decisions. Report called to [place] RN . Pt transferred to room 123 per bed. Daughter Alyse Low called and aware of room change.Marland Kitchen

## 2018-02-04 NOTE — Progress Notes (Signed)
   02/04/18 1345  Clinical Encounter Type  Visited With Patient  Visit Type Initial  Referral From Physician  Consult/Referral To [facility] responded to order for chaplain presence at patient's end of life.  Chaplain sat with patient at her bedside, offered a silent prayer then aloud encouraged patient to think or feel any prayers that she would like to include.  Chaplain maintained pastoral presence at bedside while offering energetic prayer.  Chaplain then checked in with staff regarding family availability/location and learned that family would likely return later.

## 2018-02-04 NOTE — Progress Notes (Signed)
NO CHARGE NOTE  Spoke with daughters via telephone. They have left hospital for self-care and will not be back until later this evening. We have scheduled a goals of care meeting for tomorrow 6/20 @ 1000 am. Detailed note and recommendations to follow.   Thank you for your referral.   Alda Lea, NP-BC Palliative Medicine Team  Phone: [phone] Fax: [phone] Pager: [phone] Amion: N. [place]

## 2018-02-04 NOTE — Progress Notes (Signed)
PULMONARY / CRITICAL CARE MEDICINE   Name: Sabrina Quinn MRN: [id number] DOB: 1941/06/05    ADMISSION DATE:  01/28/2018  PT PROFILE: 65 F with history of DM, found by family unresponsive.  Severe hypoglycemia noted by EMS.  Cognition did not improve with dextrose infusion.  Intubated in ED for altered cognition.  High fever noted, diagnosis of severe sepsis of unknown origin given.  MAJOR EVENTS/TEST RESULTS: 6/12 admission as above 6/12 CT head and neck: No acute findings 6/13 minimally responsive off of all sedation.  High fevers persist.  Antibiotics changed to cover possible meningitis and HSV encephalitis.  Neurology, ID consultations requested.  LP ordered but could not be performed due to recent enoxaparin. 6/14 MRI brain: No acute abnormality. LP performed 6/14 EEG: Muscle and movement artifact.  No interpretation could be made 6/17 minimally responsive despite no sedation in past 24 hours.  Coughs upon suctioning.  No significant respiratory secretions.  Tolerates minimal ventilator support.  Withdraws all extremities 06/18 extubated and tolerating well initially.  Remains obtunded.  Withdraws all extremities.  Spontaneous cough and vigorous cough upon suctioning. 06/18 EEG: general background slowing.  No epileptiform activity   INDWELLING DEVICES:: ETT 06/13 >> 06/18 L IJ CVL 6/17 >>   MICRO DATA: MRSA PCR 6/12 >> NEG Urine 6/12 >> insignificant growth Resp 6/13 >> NOF Blood 6/12 >> NEG CSF 6/14 >> no leukocytosis, bacterial culture negative CSF HSV PCR 06/14 >> NEG  ANTIMICROBIALS:  Cefepime 6/12 >> 6/13 Vanc 6/12 >> 6/14 Meropenem 6/13 >> 6/14 Acyclovir 6/13 >> 6/14 Ceftriaxone 6/14 >> 6/18   SUBJECTIVE:  Remains encephalopathic.  Not following commands.  Withdraws all extremities.  Protecting her airway adequately.  VITAL SIGNS: BP (!) 114/95   Pulse 92   Temp 99.6 F (37.6 C) (Oral)   Resp (!) 21   Ht 5\' 4"  (1.626 m)   Wt 206 lb 2.1 oz (93.5 kg)   SpO2  96%   BMI 35.38 kg/m   HEMODYNAMICS:    VENTILATOR SETTINGS:    INTAKE / OUTPUT: I/O last 3 completed shifts: In: 3784.9 [I.V.:979; Other:60; NG/GT:2532.1; IV Piggyback:213.8] Out: 2730 [Urine:2605; Emesis/NG output:125]  PHYSICAL EXAMINATION: General: NAD Neuro: PERRLA, EOMI, withdraws all extremities, increased tone BUE HEENT: NCAT, sclerae white Cardiovascular: Regular, no M Lungs: Clear anteriorly Abdomen: Soft, NT, + BS Extremities: Warm, no edema  LABS:  BMET Recent Labs  Lab 02/02/18 0443 02/03/18 0542 02/04/18 0413  NA 146* 142 146*  K 3.9 3.4* 4.4  CL 109 107 110  CO2 26 26 27   BUN 54* 49* 43*  CREATININE 1.01* 0.90 0.86  GLUCOSE 194* 202* 143*    Electrolytes Recent Labs  Lab 01/31/18 0600  02/01/18 0517 02/02/18 0443 02/03/18 0542 02/04/18 0413  CALCIUM 7.9*   < > 8.3* 8.6* 8.3* 8.6*  MG 2.4  --  2.4 2.6*  --   --   PHOS 3.6  --  3.3 2.9  --   --    < > = values in this interval not displayed.    CBC Recent Labs  Lab 02/01/18 0517 02/02/18 0443 02/04/18 0413  WBC 18.1* 14.7* 10.2  HGB 12.0 11.6* 10.9*  HCT 37.3 35.6 33.1*  PLT 350 327 278    Coag's Recent Labs  Lab 01/28/18 1855  INR 0.98    Sepsis Markers Recent Labs  Lab 01/28/18 1904 01/28/18 2130  01/29/18 0539 01/30/18 1021 02/03/18 0542 02/04/18 0413  LATICACIDVEN 2.5* 2.5*  --  2.0*  --   --   --  PROCALCITON  --   --    < > 3.35 3.02 0.24 0.13   < > = values in this interval not displayed.    ABG Recent Labs  Lab 01/28/18 1951 01/29/18 0449  PHART 7.50* 7.50*  PCO2ART 38 35  PO2ART 148* 108    Liver Enzymes Recent Labs  Lab 01/28/18 1855 02/02/18 0443  AST 129* 34  ALT 96* 40  ALKPHOS 97 55  BILITOT 0.9 0.6  ALBUMIN 3.5 2.9*    Cardiac Enzymes Recent Labs  Lab 01/28/18 1855  TROPONINI <0.03    Glucose Recent Labs  Lab 02/03/18 1535 02/03/18 2002 02/03/18 2340 02/04/18 0349 02/04/18 0733 02/04/18 1124  GLUCAP 149* 153* 181*  157* 137* 200*    CXR: No new film    ASSESSMENT / PLAN: VDRF, intubated for airway protection. Tolerating extubation Poor venous access Stage I CKD  AKI, resolved Mild hyperkalemia, recurrent Mild metabolic acidosis, resolved Mild hypernatremia, recurrent Mild elevation in transaminases, resolved Dysphagia due to severe encephalopathy Mild ICU acquired anemia Severe sepsis of unclear etiology, resolved Leukocytosis, resolved Elevated PCT, resolved DM 2 with hyperglycemia Severe hypoglycemia, resolved Presented with coma.  Now vegetative state DNR - see discussion below P:   Cont supplemental oxygen as needed to maintain SPO2 >90% Cont NTS as needed CVL placed 6/17  -Would leave in place as long as intravenous access is needed given absence of other options Monitor BMET intermittently Monitor I/Os Correct electrolytes as indicated   Will leave NGT in place for now.  -Pt has clearly expressed that she would not want a gastrostomy tube under any circumstances Continue TF protocol DVT px: Enoxaparin Monitor CBC intermittently Transfuse per usual guidelines   Monitor temp, WBC count Micro and abx as above Cont moderate scale SSI initiated 6/13 Lantus initiated 6/17  Avoid all sedating medications Neurology assistance appreciated Likely DC valproic acid today (per my discussion with Dr. Doy Mince)   FAMILY: Daughters updated at bedside by Dr. Doy Mince and myself.  They have informed us patient has executed a living will and very clearly expressed a desire to forego prolonged life support and also to forego artificial nutritional support (gastrostomy tube).  They understand that the prognosis for full recovery is very poor.  Therefore, the patient's previously defined wishes are to be honored in this situation.  To that end, I have made her DNR and we have decided against placement of gastrostomy tube.  The daughters have requested to meet with palliative care and consider  discharge to a residential hospice facility.  Transfer to [place] floor today  Merton Border, MD PCCM service [place] [phone] Pager [phone]    02/04/2018, 12:52 PM

## 2018-02-05 DIAGNOSIS — Z66 Do not resuscitate: Secondary | ICD-10-CM

## 2018-02-05 DIAGNOSIS — R401 Stupor: Secondary | ICD-10-CM

## 2018-02-05 DIAGNOSIS — Z515 Encounter for palliative care: Secondary | ICD-10-CM

## 2018-02-05 DIAGNOSIS — Z789 Other specified health status: Secondary | ICD-10-CM

## 2018-02-05 LAB — BASIC METABOLIC PANEL
Anion gap: 9 (ref 5–15)
BUN: 42 mg/dL — AB (ref 6–20)
CALCIUM: 8.8 mg/dL — AB (ref 8.9–10.3)
CO2: 28 mmol/L (ref 22–32)
CREATININE: 1.01 mg/dL — AB (ref 0.44–1.00)
Chloride: 114 mmol/L — ABNORMAL HIGH (ref 101–111)
GFR calc non Af Amer: 53 mL/min — ABNORMAL LOW (ref >60)
Glucose, Bld: 123 mg/dL — ABNORMAL HIGH (ref 65–99)
Potassium: 4.4 mmol/L (ref 3.5–5.1)
SODIUM: 151 mmol/L — AB (ref 135–145)

## 2018-02-05 LAB — GLUCOSE, CAPILLARY
GLUCOSE-CAPILLARY: 117 mg/dL — AB (ref 65–99)
GLUCOSE-CAPILLARY: 170 mg/dL — AB (ref 65–99)
Glucose-Capillary: 132 mg/dL — ABNORMAL HIGH (ref 65–99)

## 2018-02-05 MED ORDER — POLYVINYL ALCOHOL 1.4 % OP SOLN
1.0000 [drp] | Freq: Four times a day (QID) | OPHTHALMIC | Status: DC | PRN
  Filled 2018-02-05: qty 15

## 2018-02-05 MED ORDER — ACETAMINOPHEN 650 MG RE SUPP: 650.0000 mg | Freq: Four times a day (QID) | RECTAL | Status: DC | PRN

## 2018-02-05 MED ORDER — GLYCOPYRROLATE 0.2 MG/ML IJ SOLN
0.2000 mg | INTRAMUSCULAR | Status: DC | PRN
  Administered 2018-02-05 – 2018-02-06 (×2): 0.2 mg via INTRAVENOUS
  Filled 2018-02-05 (×3): qty 1

## 2018-02-05 MED ORDER — MORPHINE SULFATE (PF) 2 MG/ML IV SOLN
1.0000 mg | INTRAVENOUS | Status: DC | PRN
  Administered 2018-02-05: 19:00:00 2 mg via INTRAVENOUS
  Administered 2018-02-06 (×2): 1 mg via INTRAVENOUS
  Filled 2018-02-05 (×3): qty 1

## 2018-02-05 MED ORDER — LORAZEPAM 2 MG/ML IJ SOLN
0.5000 mg | INTRAMUSCULAR | Status: DC | PRN
  Administered 2018-02-06: 09:00:00 0.5 mg via INTRAVENOUS
  Filled 2018-02-05: qty 1

## 2018-02-05 NOTE — Progress Notes (Signed)
New Hospice Home referral received from CSW Candace Garrison following a Palliative Medicine consult. Patient is a 77 year old woman with a known history of anxiety, asthma, diabetes, and hypertension admitted from home on 6/12 after being found unresponsive. She required intubation due to hypoxia and was treated for sepsis per protocol. Patient has since been extubated and remained obtunded. Palliative Medicine was consulted for goals of care and met with patient's daughters, who have chosen to focus on comfort with transfer to the hospice home.  °Writer met in the room with daughter's Christy and Lesia to initiate education regarding  Hospice services, philosophy and team approach to care with understanding voiced. Questions answered, consents signed. Patient seen lying in bed, no response to verbal or tactile stimuli.  °Patient information faxed to referral. Plan is for transfer to the hospice home on 6/21. Hospital care team updated. Will continue to follow through discharge. Thank you for the opportunity to be involved in the care of this patient and her family.  °Karen Robertson RN, BSN, CHPN °[facility], hospital Liaison °[phone] ° ° ° °

## 2018-02-05 NOTE — Care Management Important Message (Signed)
Important Message  Patient Details  Name: Sabrina Quinn MRN: [id number] Date of Birth: [birth year]   Medicare Important Message Given:  Yes    [name] A [name] 02/05/2018, 10:19 AM

## 2018-02-05 NOTE — Progress Notes (Signed)
[facility] [place] NAME: Sabrina Quinn    MR#:  [id number]  DATE OF BIRTH:  [birth year]  SUBJECTIVE:  CHIEF COMPLAINT:   Chief Complaint  Patient presents with  . Altered Mental Status   patient extubated. NG tube in place. No change in her mental status, not responding to verbal commands. Patient made DNR in the ICU. Transferred to medical floor. 2 daughters had family meeting with palliative care today and decided comfort care   REVIEW OF SYSTEMS:  Review of Systems  Unable to perform ROS: Intubated   DRUG ALLERGIES:   Allergies  Allergen Reactions  . Penicillins Rash   VITALS:  Blood pressure (!) 174/73, pulse 98, temperature 99.8 F (37.7 C), temperature source Oral, resp. rate 20, height _0  (1.626 m), weight 93.5 kg (206 lb 2.1 oz), SpO2 92 %. PHYSICAL EXAMINATION:  Physical Exam  HENT:  Head: Normocephalic and atraumatic.  Eyes: Pupils are equal, round, and reactive to light. Conjunctivae and EOM are normal.  Neck: Normal range of motion. Neck supple. No tracheal deviation present. No thyromegaly present.  Cardiovascular: Normal rate, regular rhythm and normal heart sounds.  Pulmonary/Chest: Effort normal and breath sounds normal. No respiratory distress. She has no wheezes. She exhibits no tenderness.  Abdominal: Soft. Bowel sounds are normal. She exhibits no distension. There is no tenderness.  Musculoskeletal: Normal range of motion.  Neurological:  Not following commands  Skin: Skin is warm and dry. No rash noted.  Psychiatric:  Unable to assess due to on vent   LABORATORY PANEL:  Female CBC Recent Labs  Lab 02/04/18 0413  WBC 10.2  HGB 10.9*  HCT 33.1*  PLT 278   ------------------------------------------------------------------------------------------------------------------ Chemistries  Recent Labs  Lab 02/02/18 0443  02/05/18 0439  NA 146*   < > 151*  K 3.9   < > 4.4  CL 109   < > 114*  CO2 26    < > 28  GLUCOSE 194*   < > 123*  BUN 54*   < > 42*  CREATININE 1.01*   < > 1.01*  CALCIUM 8.6*   < > 8.8*  MG 2.6*  --   --   AST 34  --   --   ALT 40  --   --   ALKPHOS 55  --   --   BILITOT 0.6  --   --    < > = values in this interval not displayed.   RADIOLOGY:  No results found. ASSESSMENT AND PLAN:   * Sepsis: present on admission. Now resolved  . LP performed  - CSF results not consistent with meningitis .  Was on IV ceftriaxone and later stopped.  * Acute metabolic encephalopathy: could be due to hypoglycemia, unlikely meningitis per ID, Neuro - Unclear if there may be some hypoglycemic injury to brain.   - MRI of the brain -nothing acute -EEG was inconclusive   * Acute respiratory failure with hypoxia ([place])  Off ventilator support  * HTN (hypertension)   * Diabetes ([place]) -  Patient has been made do not resuscitate. Family met with palliative care today  Morning, requesting comfort care and dc  to hospice home in am  All the records are reviewed and case discussed with Care Management/Social Worker.  CODE STATUS: DNR  TOTAL TIME TAKING CARE OF THIS PATIENT: 25 minutes.   POSSIBLE D/C IN 1 DAYS, DEPENDING ON CLINICAL CONDITION.   Deretha Ertle  M.D on 02/05/2018 at 7:48 PM  Between 7am to 6pm - Pager - [phone]  After 6pm go to [email or web address] - Proofreader  [facility]  Office  [phone]  CC: Primary care physician; Cletis Athens, MD  Note: This dictation was prepared with Dragon dictation along with smaller phrase technology. Any transcriptional errors that result from this process are unintentional.

## 2018-02-05 NOTE — Progress Notes (Signed)
   02/05/18 1205  Clinical Encounter Type  Visited With Patient and family together  Visit Type Initial;Spiritual support;Other (Comment)  Referral From Nurse  Consult/Referral To Chaplain  Spiritual Encounters  Spiritual Needs Prayer;Emotional;Grief support   [facility] was approached by NP ans asked if [place] would stop and visit with PT's family as PT has become comfort care. [place] visited with PT's two daughters. Both were very pleasant and asked for the [facility] to pray for PT and shared that PT had been a good mom. [place] prayed and will follow up as needed.

## 2018-02-05 NOTE — Clinical Social Work Note (Signed)
Clinical Social Work Assessment  Patient Details  Name: Sabrina Quinn MRN: [id number] Date of Birth: 08/02/1941  Date of referral:  02/05/18               Reason for consult:  Facility Placement                Permission sought to share information with:  Case Manager, Customer service manager, Family Supports Permission granted to share information::  Yes, Verbal Permission Granted  Name::        Agency::     Relationship::     Contact Information:     Housing/Transportation Living arrangements for the past 2 months:  Single Family Home Source of Information:  Adult Children Patient Interpreter Needed:  None Criminal Activity/Legal Involvement Pertinent to Current Situation/Hospitalization:  No - Comment as needed Significant Relationships:  Adult Children Lives with:  Self Do you feel safe going back to the place where you live?  No Need for family participation in patient care:  Yes (Comment)  Care giving concerns:  Patient lives alone in [place] Worker assessment / plan:  CSW notified by [place] team that patient is appropriate for Hospice Home and family is accepting. CSW met with patient's daughters Sabrina Quinn and Sabrina Quinn at bedside. Patient is asleep and unresponsive at this time. Daughters states that they would like patient to go to hospice home in [place]. CSW explained that there are no beds there today but CSW would notify Santiago Glad, hospice liaison about their request. CSW notified Santiago Glad, hospice liaison that patient's family would like patient transferred to hospice home in [place].   Employment status:  Retired Nurse, adult PT Recommendations:  Not assessed at this time Information / Referral to community resources:     Patient/Family's Response to care:  Patient is unresponsive   Patient/Family's Understanding of and Emotional Response to Diagnosis, Current Treatment, and Prognosis:  Family  is understanding of current status   Emotional Assessment Appearance:  Appears stated age Attitude/Demeanor/Rapport:  Unresponsive Affect (typically observed):    Orientation:    Alcohol / Substance use:  Not Applicable Psych involvement (Current and /or in the community):  No (Comment)  Discharge Needs  Concerns to be addressed:  Discharge Planning Concerns Readmission within the last 30 days:  No Current discharge risk:  None Barriers to Discharge:  Hospice Bed not available   Annamaria Boots, [place] 02/05/2018, 12:18 PM
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
## 2018-02-06 DIAGNOSIS — J9690 Respiratory failure, unspecified, unspecified whether with hypoxia or hypercapnia: Secondary | ICD-10-CM

## 2018-02-06 MED ORDER — MORPHINE SULFATE (PF) 2 MG/ML IV SOLN
2.0000 mg | Freq: Once | INTRAVENOUS | Status: AC
  Administered 2018-02-06: 13:00:00 2 mg via INTRAVENOUS
  Filled 2018-02-06: qty 1

## 2018-02-06 MED ORDER — ONDANSETRON HCL 4 MG/2ML IJ SOLN: 4.0000 mg | Freq: Four times a day (QID) | INTRAMUSCULAR | 0 refills | Status: AC | PRN

## 2018-02-06 MED ORDER — LORAZEPAM 2 MG/ML IJ SOLN: 0.5000 mg | INTRAMUSCULAR | 0 refills | Status: AC | PRN

## 2018-02-06 MED ORDER — GLYCOPYRROLATE 0.2 MG/ML IJ SOLN: 0.2000 mg | INTRAMUSCULAR | Status: AC | PRN

## 2018-02-06 MED ORDER — ACETAMINOPHEN 650 MG RE SUPP: 650.0000 mg | Freq: Four times a day (QID) | RECTAL | 0 refills | Status: AC | PRN

## 2018-02-06 MED ORDER — LORAZEPAM 2 MG/ML IJ SOLN: 0.5000 mg | Freq: Once | INTRAMUSCULAR | Status: DC

## 2018-02-06 MED ORDER — MORPHINE SULFATE (PF) 2 MG/ML IV SOLN: 1.0000 mg | INTRAVENOUS | 0 refills | Status: AC | PRN

## 2018-02-06 MED ORDER — POLYVINYL ALCOHOL 1.4 % OP SOLN: 1.0000 [drp] | Freq: Four times a day (QID) | OPHTHALMIC | 0 refills | Status: AC | PRN

## 2018-02-06 NOTE — Discharge Summary (Signed)
[facility] NAME: Sabrina Quinn    MR#:  [id number]  DATE OF BIRTH:  07-13-1941  DATE OF ADMISSION:  01/28/2018 ADMITTING PHYSICIAN: Lance Coon, MD  DATE OF DISCHARGE:  01/31/2018 PRIMARY CARE PHYSICIAN: Cletis Athens, MD    ADMISSION DIAGNOSIS:  Acute respiratory failure, unspecified whether with hypoxia or hypercapnia ([place]) [J96.00] Sepsis, due to unspecified organism ([place]) [A41.9] Altered mental status, unspecified altered mental status type [R41.82]  DISCHARGE DIAGNOSIS:  Principal Problem:   Sepsis ([place]) Active Problems:   Acute respiratory failure with hypoxia ([place])   HTN (hypertension)   Asthma   Diabetes ([place])   Hyperkalemia   Altered mental status   SECONDARY DIAGNOSIS:   Past Medical History:  Diagnosis Date  . Anxiety   . Asthma   . Diabetes mellitus without complication ([place])   . H/O Clostridium difficile infection   . Hypertension     HOSPITAL COURSE:  HPI  Sabrina Quinn  is a 77 y.o. female who presents via EMS from home where she was found to be unresponsive.  Patient's daughter states that she was last known to be well the night prior.  It is unclear at this time what happened, the patient does meet sepsis criteria upfront.  Source of infection is unclear at this time.  Patient required intubation due to hypoxia due to being so obtunded.  Hospitalist were called for admission  * ftt-clinically patient condition is deteriorating.  Not responding to verbal commands or tactile stimuli.  Both daughters changed him to comfort care patient will be transferred to hospice home today   * Sepsis: present on admission. Now resolved  . LP performed  - CSF results not consistent with meningitis .  Was on IV ceftriaxone and later stopped.  * Acute metabolic encephalopathy: could be due to hypoglycemia, unlikely meningitis per ID, Neuro - Unclear if there may be some hypoglycemic injury to brain.  -MRI of the  brain -nothing acute -EEG was inconclusive   *Acute respiratory failure with hypoxia (HCC)  Off ventilator support  *HTN (hypertension)   *Diabetes ([place]) -  Patient has been made do not resuscitate. Family met with palliative care 02/04/18, requesting comfort care and dc  to hospice home today  am    DISCHARGE CONDITIONS:   guarded  CONSULTS OBTAINED:  Treatment Team:  Catarina Hartshorn, MD Alexis Goodell, MD   PROCEDURES   DRUG ALLERGIES:   Allergies  Allergen Reactions  . Penicillins Rash    DISCHARGE MEDICATIONS:   Allergies as of 01/30/2018      Reactions   Penicillins Rash      Medication List    STOP taking these medications   albuterol 108 (90 Base) MCG/ACT inhaler Commonly known as:  PROVENTIL HFA;VENTOLIN HFA   ALPRAZolam 1 MG tablet Commonly known as:  XANAX   amiloride-hydrochlorothiazide 5-50 MG tablet Commonly known as:  MODURETIC   atorvastatin 20 MG tablet Commonly known as:  LIPITOR   carbidopa-levodopa 25-100 MG tablet Commonly known as:  SINEMET IR   cetirizine 10 MG tablet Commonly known as:  ZYRTEC   citalopram 20 MG tablet Commonly known as:  CELEXA   DILT-XR 120 MG 24 hr capsule Generic drug:  diltiazem   fluticasone 50 MCG/ACT nasal spray Commonly known as:  FLONASE   furosemide 20 MG tablet Commonly known as:  LASIX   gabapentin 300 MG capsule Commonly known as:  NEURONTIN   LANTUS SOLOSTAR 100 UNIT/ML  Solostar Pen Generic drug:  Insulin Glargine   metFORMIN 500 MG tablet Commonly known as:  GLUCOPHAGE   montelukast 10 MG tablet Commonly known as:  SINGULAIR   omeprazole 20 MG capsule Commonly known as:  PRILOSEC   ondansetron 4 MG disintegrating tablet Commonly known as:  ZOFRAN ODT   potassium chloride SA 20 MEQ tablet Commonly known as:  KLOR-CON M20   predniSONE 50 MG tablet Commonly known as:  DELTASONE     TAKE these medications   acetaminophen 650 MG suppository Commonly known  as:  TYLENOL Place 1 suppository (650 mg total) rectally every 6 (six) hours as needed for mild pain (or Fever >/= 101).   glycopyrrolate 0.2 MG/ML injection Commonly known as:  ROBINUL Inject 1 mL (0.2 mg total) into the vein every 4 (four) hours as needed (excessive secretions).   LORazepam 2 MG/ML injection Commonly known as:  ATIVAN Inject 0.25-0.5 mLs (0.5-1 mg total) into the vein every 4 (four) hours as needed for anxiety or seizure.   morphine 2 MG/ML injection Inject 0.5-1 mLs (1-2 mg total) into the vein every 2 (two) hours as needed (or shortness of breath).   ondansetron 4 MG/2ML Soln injection Commonly known as:  ZOFRAN Inject 2 mLs (4 mg total) into the vein every 6 (six) hours as needed for nausea.   polyvinyl alcohol 1.4 % ophthalmic solution Commonly known as:  LIQUIFILM TEARS Place 1 drop into both eyes 4 (four) times daily as needed for dry eyes.        DISCHARGE INSTRUCTIONS:   Transfer patient to hospice home today  DIET:  prn  DISCHARGE CONDITION:  Fair  ACTIVITY:  Bedrest  OXYGEN:  Home Oxygen: no   Oxygen Delivery: 2 liters/min via [place] prn for comfort care  DISCHARGE LOCATION:  Hospice home  If you experience worsening of your admission symptoms, develop shortness of breath, life threatening emergency, suicidal or homicidal thoughts you must seek medical attention immediately by calling 911 or calling your MD immediately  if symptoms less severe.  You Must read complete instructions/literature along with all the possible adverse reactions/side effects for all the Medicines you take and that have been prescribed to you. Take any new Medicines after you have completely understood and accpet all the possible adverse reactions/side effects.   Please note  You were cared for by a hospitalist during your hospital stay. If you have any questions about your discharge medications or the care you received while you were in the hospital after you are  discharged, you can call the unit and asked to speak with the hospitalist on call if the hospitalist that took care of you is not available. Once you are discharged, your primary care physician will handle any further medical issues. Please note that NO REFILLS for any discharge medications will be authorized once you are discharged, as it is imperative that you return to your primary care physician (or establish a relationship with a primary care physician if you do not have one) for your aftercare needs so that they can reassess your need for medications and monitor your lab values.     Today  Chief Complaint  Patient presents with  . Altered Mental Status   Patient is altered not responding to verbal commands or tactile stimulation Made comfort care yesterday and transferring to hospice home today as per family request  ROS: Unobtainable  VITAL SIGNS:  Blood pressure (!) 145/93, pulse (!) 107, temperature 99.6 F (37.6 C), temperature  source Axillary, resp. rate 20, height '5\' 4"'  (1.626 m), weight 93.5 kg (206 lb 2.1 oz), SpO2 94 %.  I/O:    Intake/Output Summary (Last 24 hours) at 02/08/2018 1018 Last data filed at 01/19/2018 8185 Gross per 24 hour  Intake 30 ml  Output 1200 ml  Net -1170 ml    PHYSICAL EXAMINATION:  GENERAL:  77 y.o.-year-old patient lying in the bed with no acute distress.  EYES: Pupils equal, round, reactive to light and accommodation. No scleral icterus. HEENT: Head atraumatic, normocephalic. Oropharynx and nasopharynx clear.  NECK:  Supple, no jugular venous distention. No thyroid enlargement, no tenderness.  LUNGS: Normal breath sounds bilaterally, no wheezing, rales,rhonchi or crepitation. No use of accessory muscles of respiration.  CARDIOVASCULAR: S1, S2 normal. No murmurs, rubs, or gallops.  ABDOMEN: Soft, non-tender, non-distended. Bowel sounds present EXTREMITIES: No pedal edema, cyanosis, or clubbing.  NEUROLOGIC: Altered and not arousable not  responding to verbal commands PSYCHIATRIC: The patient is encephalopathic and not arousable  sKIN: No obvious rash  DATA REVIEW:   CBC Recent Labs  Lab 02/04/18 0413  WBC 10.2  HGB 10.9*  HCT 33.1*  PLT 278    Chemistries  Recent Labs  Lab 02/02/18 0443  02/05/18 0439  NA 146*   < > 151*  K 3.9   < > 4.4  CL 109   < > 114*  CO2 26   < > 28  GLUCOSE 194*   < > 123*  BUN 54*   < > 42*  CREATININE 1.01*   < > 1.01*  CALCIUM 8.6*   < > 8.8*  MG 2.6*  --   --   AST 34  --   --   ALT 40  --   --   ALKPHOS 55  --   --   BILITOT 0.6  --   --    < > = values in this interval not displayed.    Cardiac Enzymes No results for input(s): TROPONINI in the last 168 hours.  Microbiology Results  Results for orders placed or performed during the hospital encounter of 01/28/18  CULTURE, BLOOD (ROUTINE X 2) w Reflex to ID Panel     Status: None   Collection Time: 01/28/18  9:30 PM  Result Value Ref Range Status   Specimen Description BLOOD LEFT ANTECUBITAL  Final   Special Requests   Final    BOTTLES DRAWN AEROBIC AND ANAEROBIC Blood Culture adequate volume   Culture   Final    NO GROWTH 5 DAYS Performed at [facility], [address] [place], [place] [address]    Report Status 02/02/2018 FINAL  Final  CULTURE, BLOOD (ROUTINE X 2) w Reflex to ID Panel     Status: None   Collection Time: 01/28/18  9:45 PM  Result Value Ref Range Status   Specimen Description BLOOD BLOOD LEFT ARM  Final   Special Requests   Final    BOTTLES DRAWN AEROBIC AND ANAEROBIC Blood Culture adequate volume   Culture   Final    NO GROWTH 5 DAYS Performed at [facility], [address], [place], [place] [address]    Report Status 02/02/2018 FINAL  Final  MRSA PCR Screening     Status: None   Collection Time: 01/28/18 11:19 PM  Result Value Ref Range Status   MRSA by PCR NEGATIVE NEGATIVE Final    Comment:        The GeneXpert MRSA Assay (FDA approved  for NASAL  specimens only), is one component of a comprehensive MRSA colonization surveillance program. It is not intended to diagnose MRSA infection nor to guide or monitor treatment for MRSA infections. Performed at [facility], [address], [place], [place] [address]   Urine Culture     Status: Abnormal   Collection Time: 01/29/18  1:06 AM  Result Value Ref Range Status   Specimen Description   Final    URINE, RANDOM Performed at [facility], [address] [place], [place] [address]    Special Requests   Final    NONE Performed at [facility], [address] [place], [place] [address]    Culture (A)  Final    <10,000 COLONIES/mL INSIGNIFICANT GROWTH Performed at [facility] [address], [place], [place] [address]    Report Status 01/30/2018 FINAL  Final  Culture, respiratory (NON-Expectorated)     Status: None   Collection Time: 01/29/18 11:44 AM  Result Value Ref Range Status   Specimen Description   Final    TRACHEAL ASPIRATE Performed at [facility], [address] [place], [place] [address]    Special Requests   Final    NONE Performed at [facility], [address] [place], [place] [address]    Gram Stain   Final    ABUNDANT WBC PRESENT,BOTH PMN AND MONONUCLEAR RARE GRAM POSITIVE COCCI    Culture   Final    Consistent with normal respiratory flora. Performed at [facility], [place] [address], [place], [place] [address]    Report Status 02/01/2018 FINAL  Final  CSF culture     Status: None   Collection Time: 01/30/18  8:56 AM  Result Value Ref Range Status   Specimen Description   Final    CSF Performed at [facility], [address], [place], [place] [address]    Special Requests   Final    NONE Performed at [facility], [address] [place], [place] [address]    Gram Stain   Final    NO ORGANISMS SEEN FEW WBC SEEN MODERATE RED BLOOD CELLS Performed at  [facility], [address] [place], [place] [address]    Culture   Final    NO GROWTH 3 DAYS Performed at [facility], [place] [address] [place], [place] [address]    Report Status 02/03/2018 FINAL  Final  C difficile quick scan w PCR reflex     Status: None   Collection Time: 02/01/18  4:09 AM  Result Value Ref Range Status   C Diff antigen NEGATIVE NEGATIVE Final   C Diff toxin NEGATIVE NEGATIVE Final   C Diff interpretation No C. difficile detected.  Final    Comment: Performed at [facility], [address] [place], [place] [address]    RADIOLOGY:  Dg Chest 1 View  Result Date: 02/02/2018 CLINICAL DATA:  Status post central line placement EXAM: CHEST  1 VIEW COMPARISON:  Film from earlier in the same day. FINDINGS: Endotracheal tube and nasogastric catheter are again seen and stable. Left jugular central line is now noted at the proximal superior vena cava. No pneumothorax is seen. No focal confluent infiltrate is noted. No acute bony abnormality is seen. IMPRESSION: No pneumothorax following central line placement Electronically Signed   By: Inez Catalina M.D.   On: 02/02/2018 17:26   Dg Abd 1 View  Result Date: 02/02/2018 CLINICAL DATA:  NG tube placement EXAM: ABDOMEN - 1 VIEW COMPARISON:  01/28/2018 FINDINGS: NG tube tip is in the mid stomach. Prior cholecystectomy. Mild diffuse gaseous distention of bowel. IMPRESSION: NG tube tip in the mid stomach. Electronically Signed   By: Rolm Baptise M.D.   On: 02/02/2018 12:50    EKG:   Orders placed or performed during the hospital encounter of 01/28/18  . ED EKG  . ED EKG  . EKG 12-Lead  . EKG 12-Lead      Management plans discussed with the patient, family and they are in agreement.  CODE STATUS:     Code Status Orders  (From admission, onward)        Start     Ordered   02/05/18 1053  Do not attempt resuscitation (DNR)  Continuous    Question Answer Comment  In the event of cardiac or  respiratory ARREST Do not call a "code blue"   In the event of cardiac or respiratory ARREST Do not perform Intubation, CPR, defibrillation or ACLS   In the event of cardiac or respiratory ARREST Use medication by any route, position, wound care, and other measures to relive pain and suffering. May use oxygen, suction and manual treatment of airway obstruction as needed for comfort.      02/05/18 1059    Code Status History    Date Active Date Inactive Code Status Order ID Comments User Context   02/04/2018 1156 02/05/2018 1059 DNR [id number]  Wilhelmina Mcardle, MD Inpatient   01/28/2018 2244 02/04/2018 1156 Full Code [id number]  Lance Coon, MD Inpatient    Advance Directive Documentation     Most Recent Value  Type of Advance Directive  Healthcare Power of [place], Living will  Pre-existing out of facility DNR order (yellow form or pink MOST form)  -  "MOST" Form in Place?  -      TOTAL TIME TAKING CARE OF THIS PATIENT: 43  minutes.   Note: This dictation was prepared with Dragon dictation along with smaller phrase technology. Any transcriptional errors that result from this process are unintentional.   '@M[facility]' @  on 02/11/2018 at 10:18 AM  Between 7am to 6pm - Pager - [phone]  After 6pm go to [email or web address] - password [email or web address] [facility]  Office  [phone]  CC: Primary care physician; Cletis Athens, MD

## 2018-02-06 NOTE — Clinical Social Work Note (Signed)
Patient will be transported to [facility] today. CSW notified family at bedside of transport. Santiago Glad, hospice liaison will call report and call for transport.   Ken Caryl, [place]

## 2018-02-06 NOTE — Progress Notes (Signed)
Daily Progress Note   Patient Name: Sabrina Quinn       Date: 01/30/2018 DOB: [birth year]  Age: 77 y.o. MRN#: [id number] Attending Physician: Nicholes Mango, MD Primary Care Physician: Cletis Athens, MD Admit Date: 01/28/2018  Reason for Consultation/Follow-up: Non pain symptom management, Pain control and Psychosocial/spiritual support  Subjective: Patient lying in bed unresponsive/obtunded. Respiratory rate increased compared to yesterday (40) with audible secretions. Theadora Rama, RN notified and PRNs to be given for comfort and respiratory support. No family at the bedside.   Spoke with daughter [name] over the phone and followed up from our discussion yesterday. Both sisters were together and verbalized the plan for her to await bed availability at hospice home.   Chart Reviewed and report received from bedside RN.    Length of Stay: 9  Current Medications: Scheduled Meds:  . chlorhexidine gluconate (MEDLINE KIT)  15 mL Mouth Rinse BID  . sodium chloride flush  10-40 mL Intracatheter Q12H    Continuous Infusions: . sodium chloride 10 mL/hr at 02/02/18 1801    PRN Meds: sodium chloride, acetaminophen, glycopyrrolate, LORazepam, morphine injection, ondansetron (ZOFRAN) IV, polyvinyl alcohol, sodium chloride flush  Physical Exam    Constitutional:  Obtunded  Cardiovascular: Normal rate. Exam reveals decreased pulses.  Pulmonary/Chest: She has decreased breath sounds.  Snoring   Abdominal: Soft. Bowel sounds are decreased.  Neurological: She is unresponsive.  Psychiatric: She expresses inappropriate judgment.  Obtunded   Nursing note and vitals reviewed.  Vital Signs: BP (!) 145/93 (BP Location: Left Arm)   Pulse (!) 107   Temp 99.6 F (37.6 C) (Axillary)   Resp (!) 43 Comment:  medications given to help with respiratory effort see mar  Ht _0  (1.626 m)   Wt 93.5 kg (206 lb 2.1 oz)   SpO2 94%   BMI 35.38 kg/m  SpO2: SpO2: 94 % O2 Device: O2 Device: Room Air O2 Flow Rate: O2 Flow Rate (L/min): 1 L/min  Intake/output summary:   Intake/Output Summary (Last 24 hours) at 02/04/2018 1134 Last data filed at 01/27/2018 0629 Gross per 24 hour  Intake 30 ml  Output 1200 ml  Net -1170 ml   LBM: Last BM Date: 02/01/18 Baseline Weight: Weight: 98 kg (216 lb) Most recent weight: Weight: 93.5 kg (206 lb 2.1 oz)  Palliative Assessment/Data:PPS 10%    Patient Active Problem List   Diagnosis Date Noted  . Acute respiratory failure with hypoxia ([place]) 01/28/2018  . Sepsis ([place]) 01/28/2018  . HTN (hypertension) 01/28/2018  . Asthma 01/28/2018  . Diabetes ([place]) 01/28/2018  . Hyperkalemia 01/28/2018  . Altered mental status     Palliative Care Assessment & Plan   Patient Profile: 77 y.o. female admitted on 01/28/2018 from home where she was found by her daughter unresponsive. Patient has a past medical history significant for anxiety, asthma, diabetes, and hypertension. During ED course daughter reports that she was last known to be seen well the night prior.Per the daughter she saw her mom early during the day and thought she was sleeping and later came back and realized something was wrong as she could not wake her up. Patient required intubation due to hypoxia. Since admission patient has been extubated, however she remains obtunded. She has been seen by neurology and EEG on 6/18 showed general background slowing. Palliative Medicine team has been consulted for goals of care discussion.   Recommendations/Plan:  DNR/DNI at family's request  FULL COMFORT MEASURES  Social Work securing residential hospice placement. Santiago Glad, RN [facility]) has also been made aware.    Tylenol suppository for mild pain or fever  Robinul PRN for excessive secretions    Ativan PRN for anxiety or seizure activity  Morphine PRN for pain and/or shortness of breath  Zofran PRN for nausea  Liquifilm Tears for dry eyes   Continue foley cath for end-of-life care.   RN to medicate prior to transport to facility for comfort.   Palliative Medicine team will continue to support patient, family, and medical team during hospitalization.   Goals of Care and Additional Recommendations:  Limitations on Scope of Treatment: Full Comfort Care  Code Status:    Code Status Orders  (From admission, onward)        Start     Ordered   02/05/18 1053  Do not attempt resuscitation (DNR)  Continuous    Question Answer Comment  In the event of cardiac or respiratory ARREST Do not call a "code blue"   In the event of cardiac or respiratory ARREST Do not perform Intubation, CPR, defibrillation or ACLS   In the event of cardiac or respiratory ARREST Use medication by any route, position, wound care, and other measures to relive pain and suffering. May use oxygen, suction and manual treatment of airway obstruction as needed for comfort.      02/05/18 1059    Code Status History    Date Active Date Inactive Code Status Order ID Comments User Context   02/04/2018 1156 02/05/2018 1059 DNR [id number]  Wilhelmina Mcardle, MD Inpatient   01/28/2018 2244 02/04/2018 1156 Full Code [id number]  Lance Coon, MD Inpatient    Advance Directive Documentation     Most Recent Value  Type of Advance Directive  Healthcare Power of Attorney, Living will  Pre-existing out of facility DNR order (yellow form or pink MOST form)  -  "MOST" Form in Place?  -      Prognosis:   Hours - Days--in the setting of hypoxia, obtunded, no po intake, no mobility, diabetes, minimum brain activity, full comfort measures  Discharge Planning:  Hospice facility  Care plan was discussed with patient's family, CSW, bedside RN, and Dr. Margaretmary Eddy.   Thank you for allowing the Palliative Medicine Team to  assist in the care of this patient.   Total Time  35 min.  Prolonged Time Billed  NO      Greater than 50%  of this time was spent counseling and coordinating care related to the above assessment and plan.  Alda Lea, NP-BC Palliative Medicine Team  Phone: [phone] Fax: [phone] Pager: [phone] Amion: Bjorn Pippin   Please contact Palliative Medicine Team phone at [phone] for questions and concerns.

## 2018-02-06 NOTE — Progress Notes (Signed)
Follow up visit to new hospice home referral. Patient seen lying in bed, respiratory rate 48, patient with audible secretions. No family present Staff RN Velna Hatchet present and had just medicated patient with lorazepam and robinul. Writer requested to have patient be given her PRN IV morphine. No family at bed side. Writer contacted patient's daughter [name] via telephone to let her know that patient would be transferred to the [facility] this morning, she and her sister remained in agreement. Rectal tube to be removed, foley and triple lumen central line to remain.  Report called to the hospice home, EMS notified for transport. Hospital care team aware. Signed DNR in place in discharge packet. Discharge summary faxed to referral. Flo Shanks RN, BSN, [facility] and [facility] [place], hospital Liaison [phone]

## 2018-02-06 NOTE — Plan of Care (Signed)
Not applicable sections, comfort care sections

## 2018-02-16 DEATH — deceased

## 2019-10-20 IMAGING — CR DG CHEST 2V
1 series · 2 of 2 positions shown · non-contrast
Comparison: 09/03/2015 and prior radiographs

CLINICAL DATA: Productive cough for 4 days.

EXAM:
CHEST - 2 VIEW

[Series 1: dg chest 2 view · 0.14mm/px · 2 of 2 slices shown]
[im 1/2]
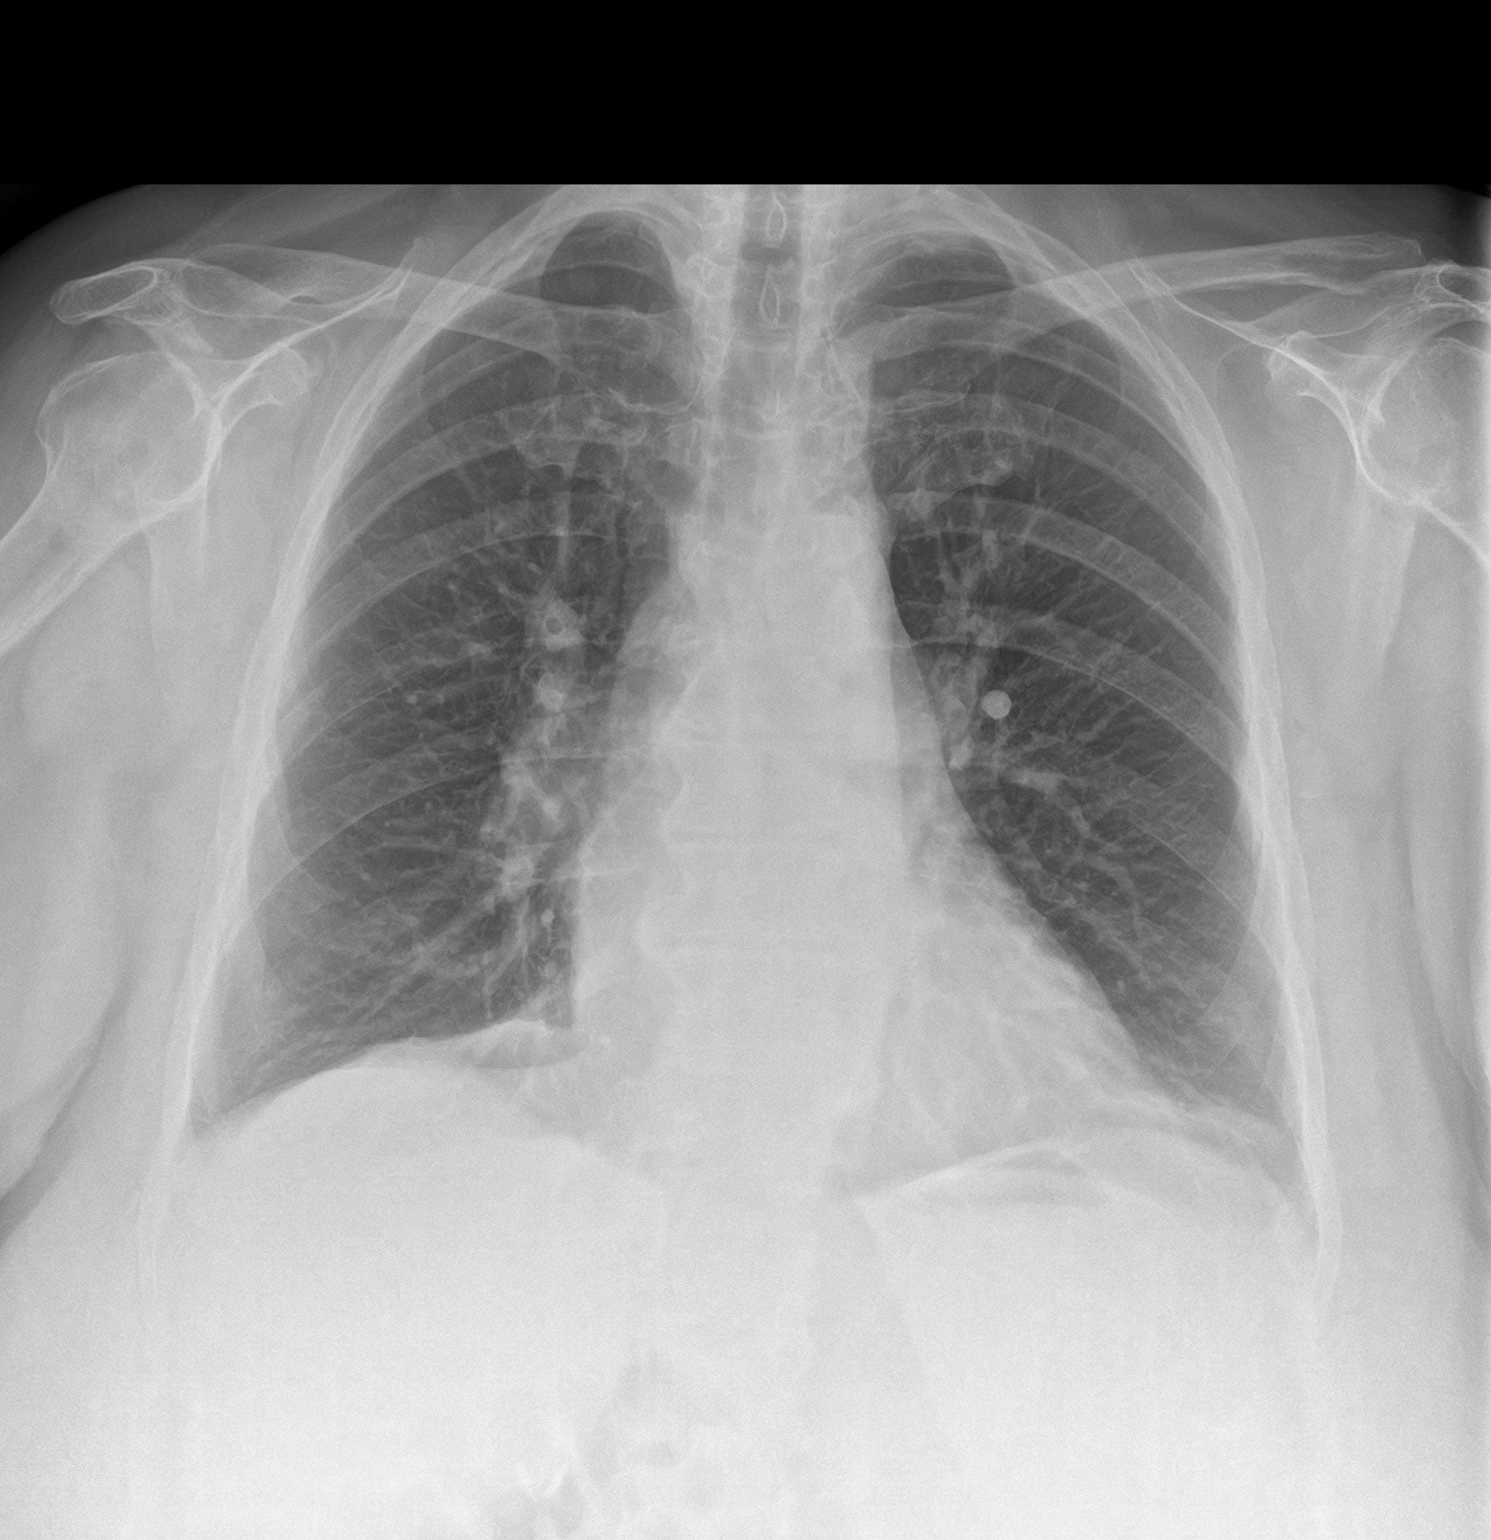
[im 2/2]
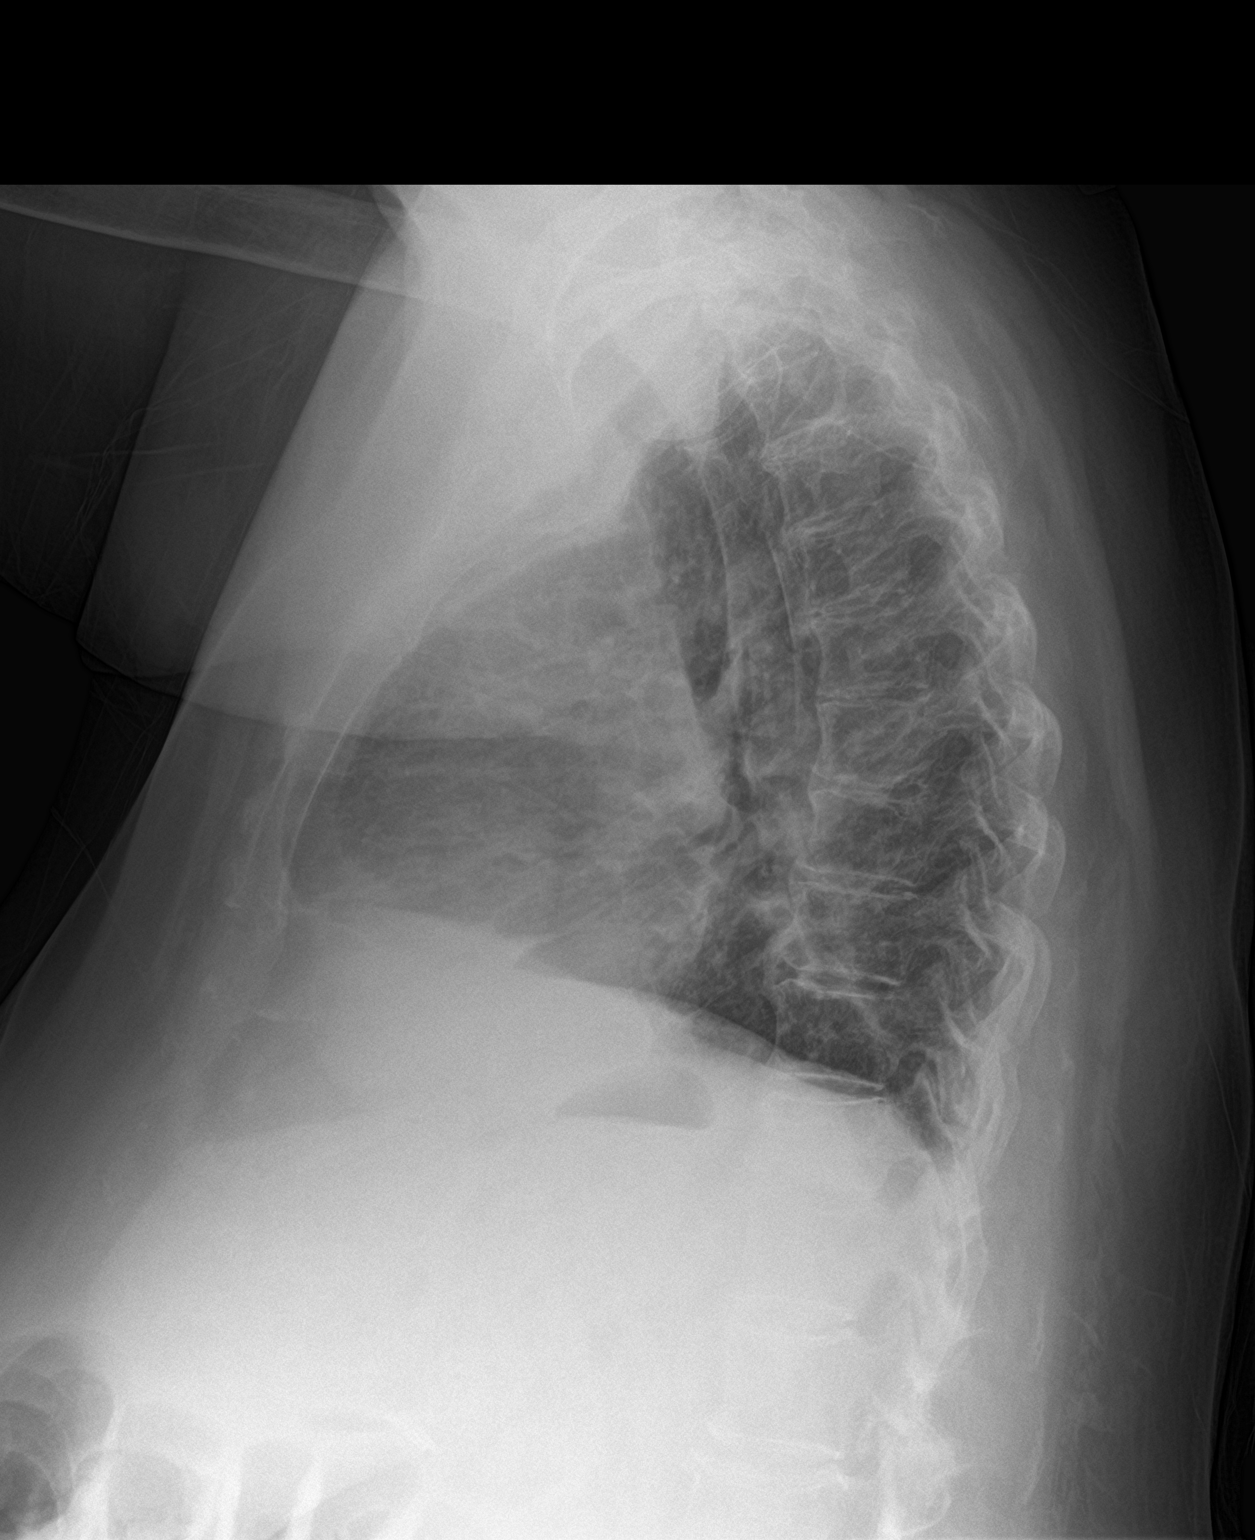

[2 of 2 positions shown; findings below may reference images not displayed]

FINDINGS: The cardiomediastinal silhouette is unchanged with stable mild RIGHT
hilar fullness since 6998.

Mild LEFT basilar scarring again noted.

There is no evidence of focal airspace disease, pulmonary edema,
suspicious pulmonary nodule/mass, pleural effusion, or pneumothorax.

No acute bony abnormalities are identified.
IMPRESSION: No active cardiopulmonary disease.

## 2019-10-29 IMAGING — CT CT CERVICAL SPINE W/O CM
4 of 9 series · 10 of 33 positions shown, 11 images · non-contrast
Comparison: None.

CLINICAL DATA: 77-year-old female found unresponsive at home.

EXAM:
CT HEAD WITHOUT CONTRAST
CT CERVICAL SPINE WITHOUT CONTRAST
TECHNIQUE: Multidetector CT imaging of the head and cervical spine was
performed following the standard protocol without intravenous
contrast. Multiplanar CT image reconstructions of the cervical spine
were also generated.

[Series 9: c spine soft · axial · 0.29mm/px · z∈[+262,+322]mm · 2 of 91 slices shown]
[im 31/91  soft-tissue]
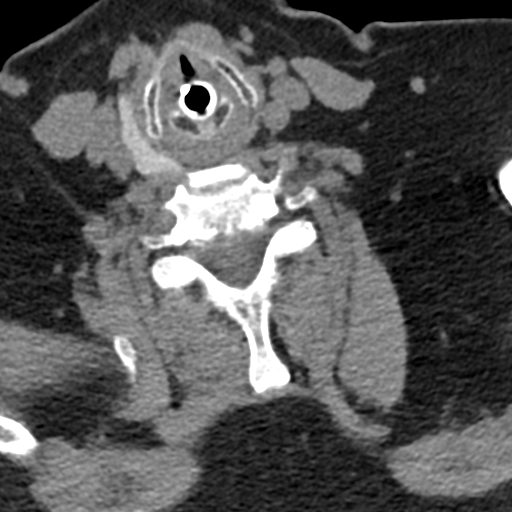
[im 61/91  soft-tissue]
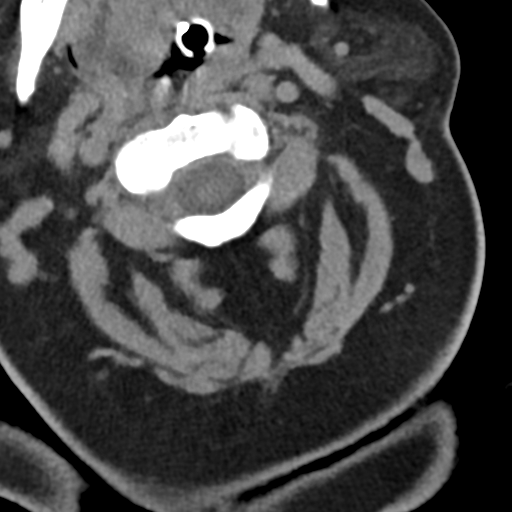

[Series 10: sagittal bone · sagittal · 0.31mm/px · 5 of 56 slices shown]
[im 10/56  bone]
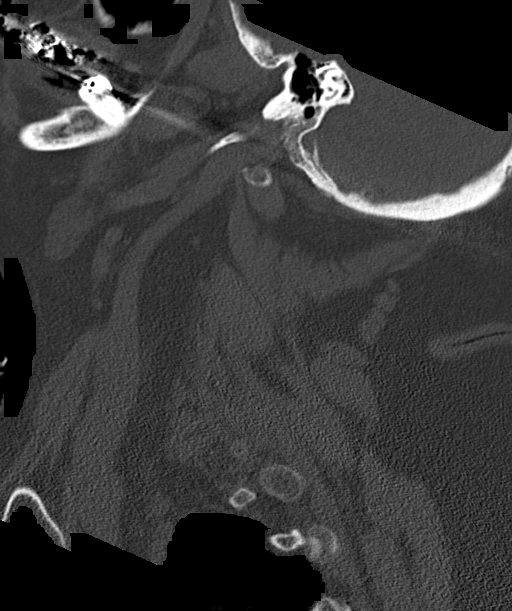
[im 19/56  bone]
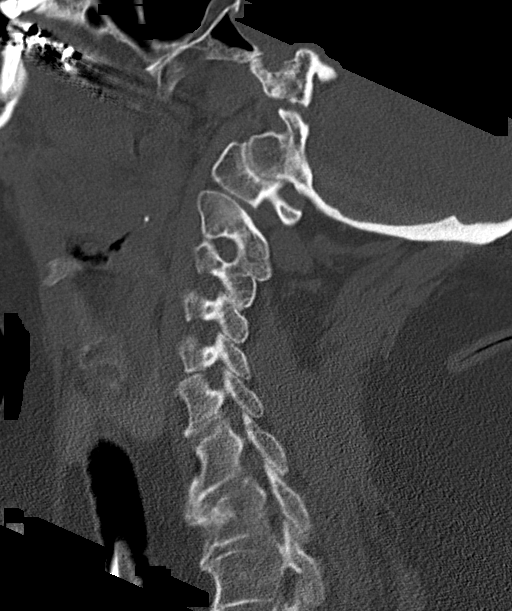
[im 28/56  bone]
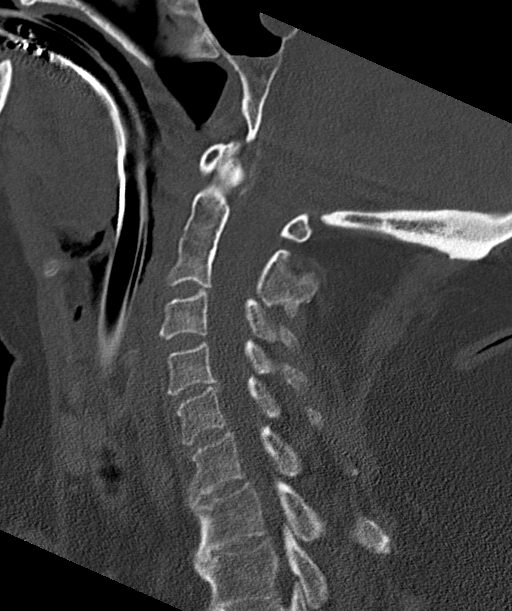
[im 37/56  bone]
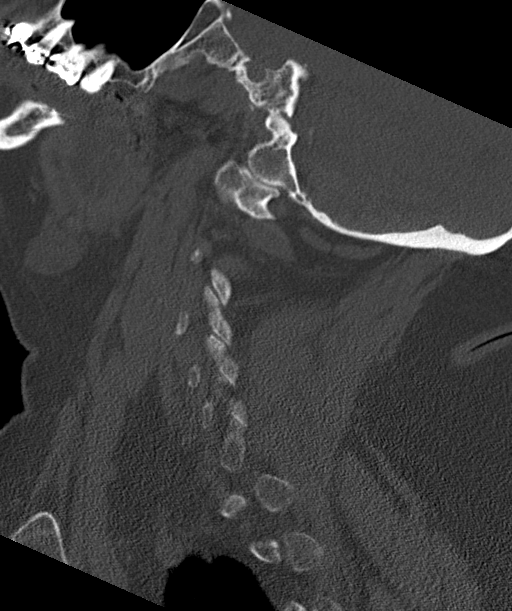
[im 46/56  bone]
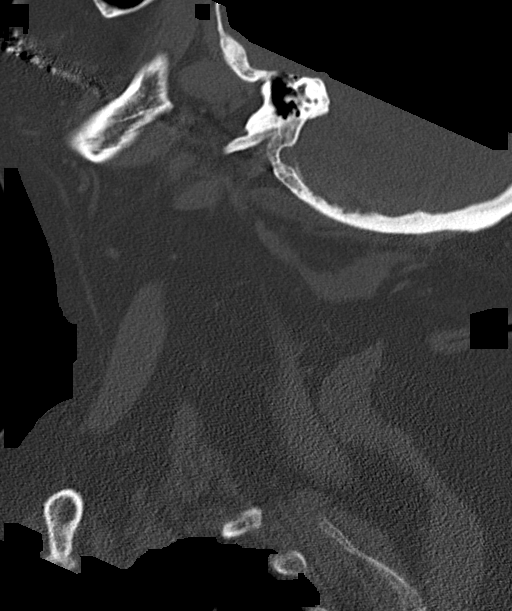

[Series 11: coronal bone · coronal · 0.25mm/px · 1 of 63 slices shown]
[im 32/63  bone]
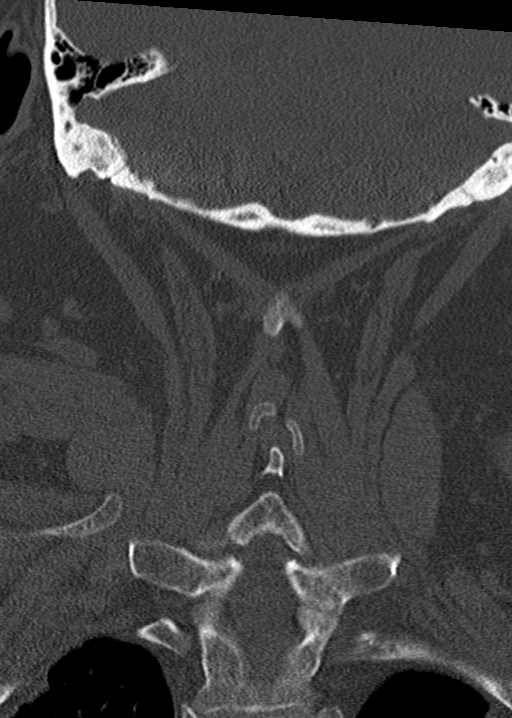

[Series 12: orthogonal bone · axial · 0.26mm/px · z∈[+239,+295]mm · 2 of 90 slices shown, 3 images]
[im 30/90  soft-tissue]
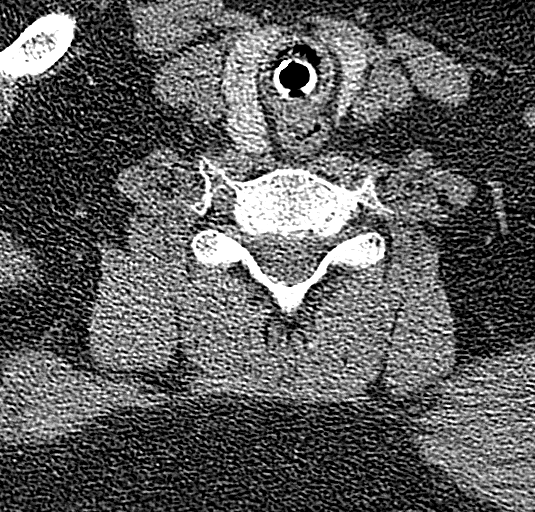
[im 30/90  bone]
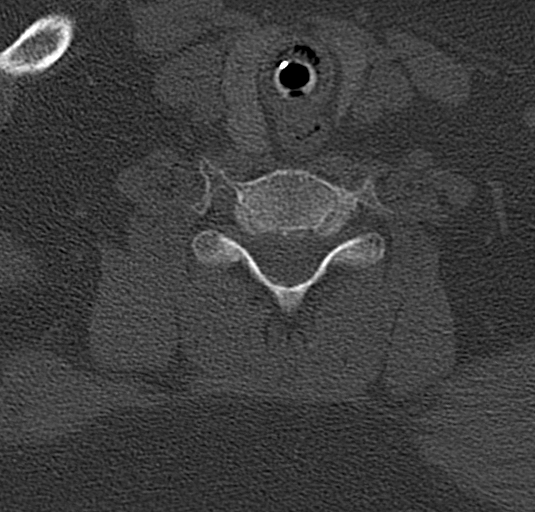
[im 60/90  bone]
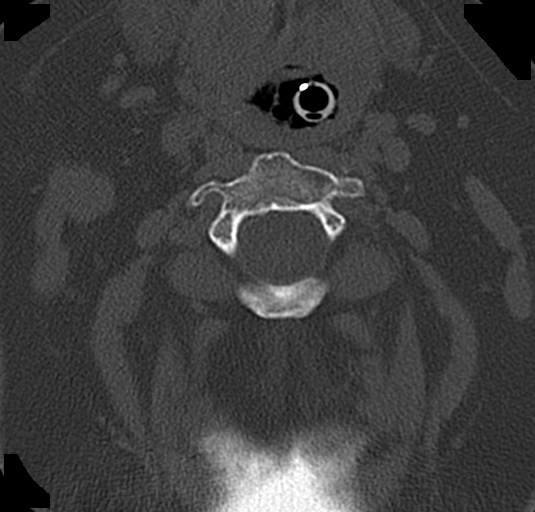

[10 of 33 positions shown; findings below may reference images not displayed]

FINDINGS: CT HEAD FINDINGS

Brain: Patchy and confluent areas of decreased attenuation are noted
throughout the deep and periventricular white matter of the cerebral
hemispheres bilaterally, compatible with chronic microvascular
ischemic disease. Physiologic calcifications are noted in the basal
ganglia bilaterally. No evidence of acute infarction, hemorrhage,
hydrocephalus, extra-axial collection or mass lesion/mass effect.

Vascular: No hyperdense vessel or unexpected calcification.

Skull: Normal. Negative for fracture or focal lesion.

Sinuses/Orbits: No acute finding.

Other: Intubated patient.

CT CERVICAL SPINE FINDINGS

Alignment: Normal.

Skull base and vertebrae: No acute fracture. No primary bone lesion
or focal pathologic process.

Soft tissues and spinal canal: No prevertebral fluid or swelling. No
visible canal hematoma.

Disc levels: Probable congenital fusion of C2 and C3. Mild
multilevel degenerative disc disease, most severe at C7-T1 and
T1-T2. Mild multilevel facet arthropathy.

Upper chest: Intubated patient.

Other: None.
IMPRESSION: 1. No acute intracranial abnormalities.
2. No signs of acute traumatic injury to the cervical spine.
3. Mild cerebral atrophy.
